# Patient Record
Sex: Female | Born: 1970 | Race: White | Hispanic: Yes | Marital: Married | Smoking: Never smoker
Health system: Southern US, Community
[De-identification: ages and names within clinical notes are randomized; demographics above are authoritative.]

## PROBLEM LIST (undated history)

## (undated) DIAGNOSIS — D649 Anemia, unspecified: Secondary | ICD-10-CM

## (undated) DIAGNOSIS — I10 Essential (primary) hypertension: Secondary | ICD-10-CM

## (undated) DIAGNOSIS — E119 Type 2 diabetes mellitus without complications: Secondary | ICD-10-CM

## (undated) DIAGNOSIS — E7800 Pure hypercholesterolemia, unspecified: Secondary | ICD-10-CM

## (undated) HISTORY — DX: Anemia, unspecified: D64.9

## (undated) HISTORY — PX: DILATION AND CURETTAGE OF UTERUS: SHX78

## (undated) HISTORY — DX: Type 2 diabetes mellitus without complications: E11.9

## (undated) HISTORY — DX: Essential (primary) hypertension: I10

---

## 2005-09-15 ENCOUNTER — Emergency Department (HOSPITAL_COMMUNITY): Admission: EM | Admit: 2005-09-15 | Discharge: 2005-09-15 | Payer: Self-pay | Admitting: Emergency Medicine

## 2006-05-23 IMAGING — CT CT PELVIS W/ CM
1 of 3 series · 13 of 32 positions shown, 18 images · IV contrast (omnipaque)
Comparison: none

CLINICAL DATA: 34-year-old with abdominal pain and vomiting.  
 ABDOMEN CT WITH CONTRAST:
TECHNIQUE: Multidetector CT imaging of the abdomen was performed following the standard protocol during bolus administration of intravenous contrast.
 Contrast:  125 cc Omnipaque 300
TECHNIQUE: Multidetector CT imaging of the pelvis was performed following the standard protocol during bolus administration of intravenous contrast.

[Series 2: abd/pel 5.0 b30f · axial · 0.74mm/px · z∈[+725,+1175]mm · 13 of 101 slices shown, 18 images]
[im 6/101  soft-tissue]
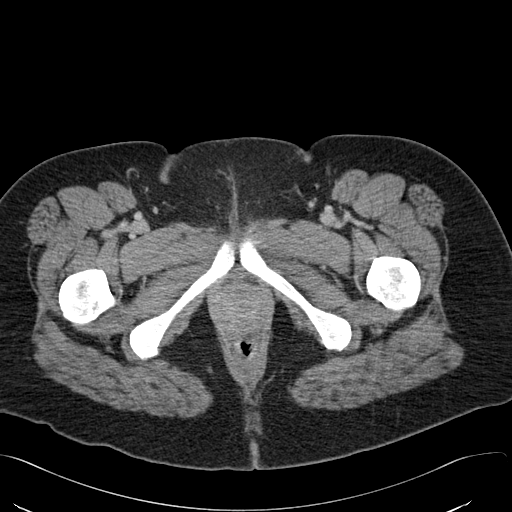
[im 6/101  bone]
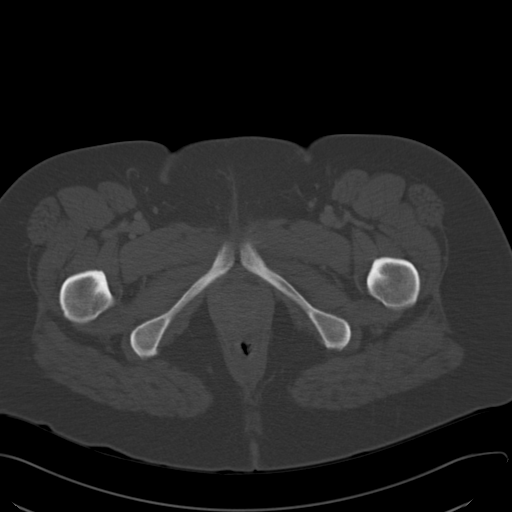
[im 16/101  soft-tissue]
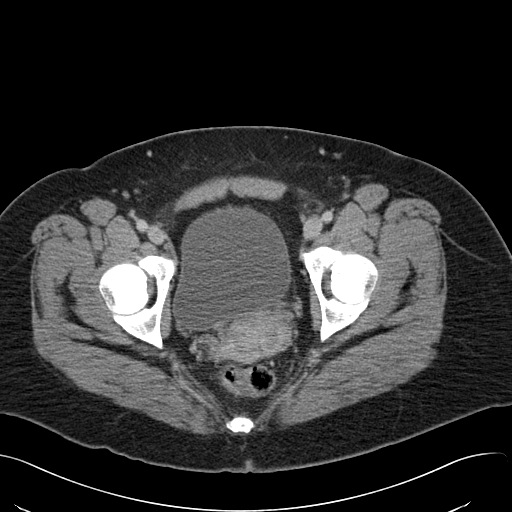
[im 21/101  soft-tissue]
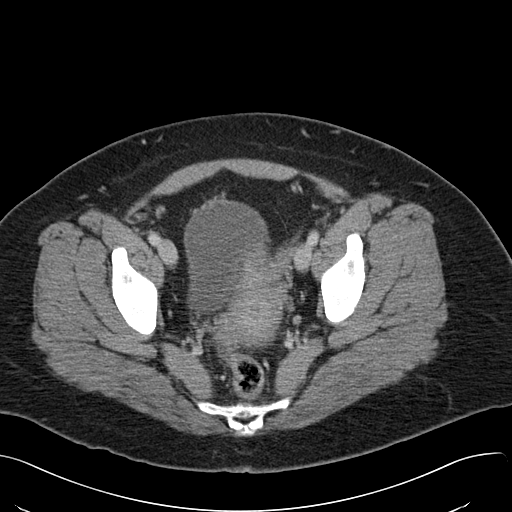
[im 31/101  soft-tissue]
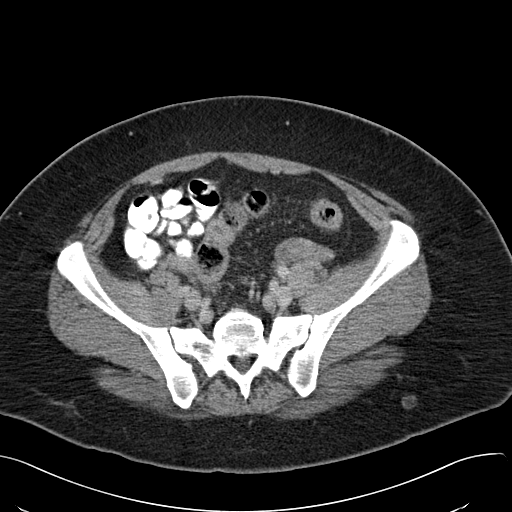
[im 41/101  soft-tissue]
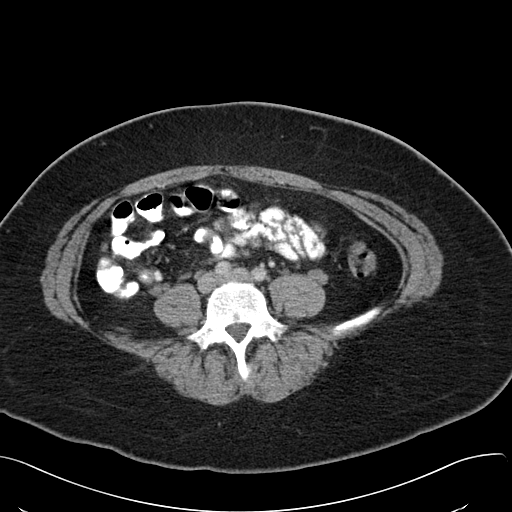
[im 46/101  soft-tissue]
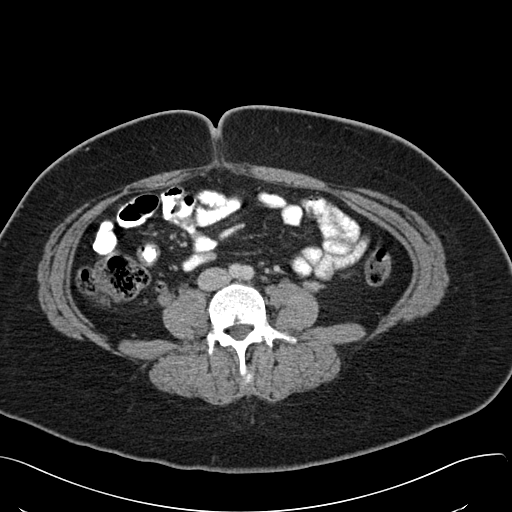
[im 56/101  soft-tissue]
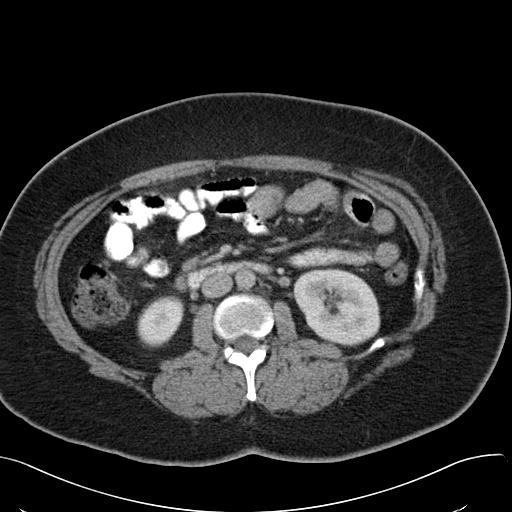
[im 61/101  soft-tissue]
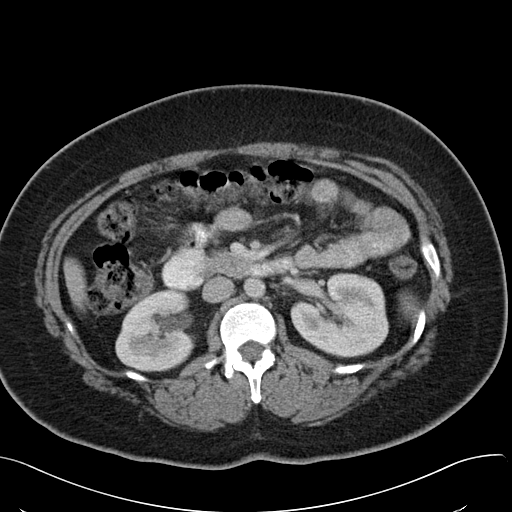
[im 71/101  soft-tissue]
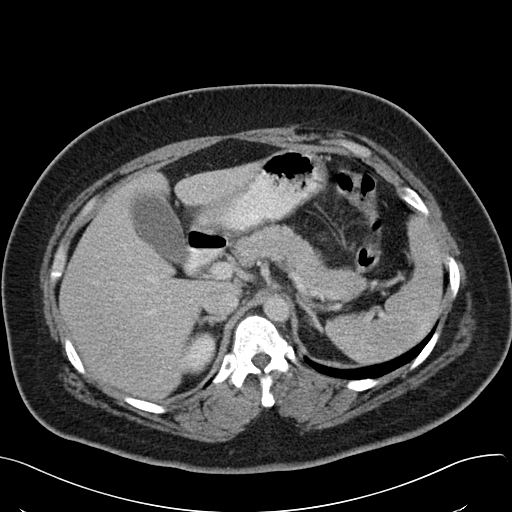
[im 71/101  bone]
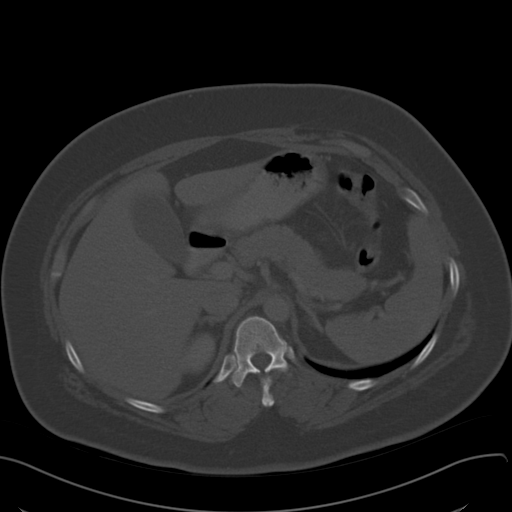
[im 81/101  soft-tissue]
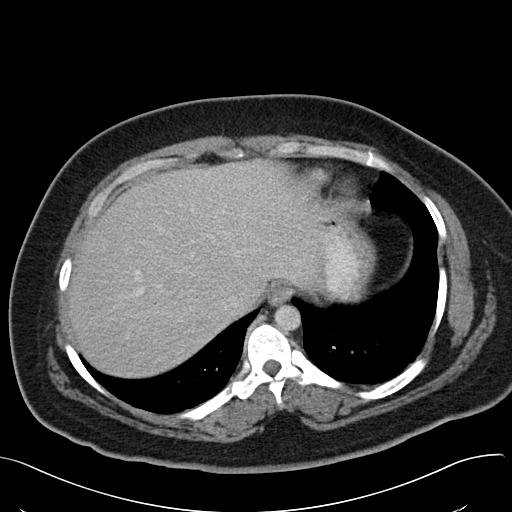
[im 81/101  lung]
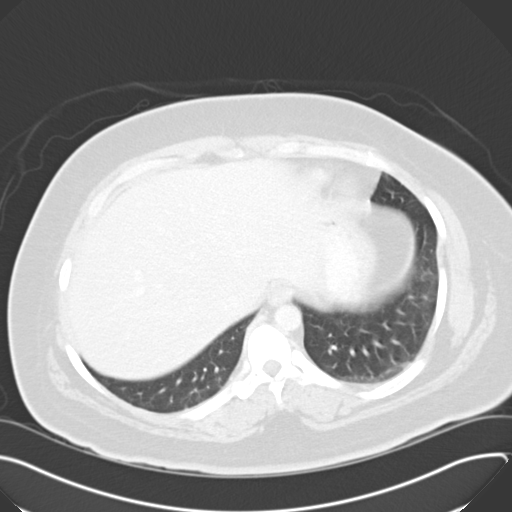
[im 86/101  soft-tissue]
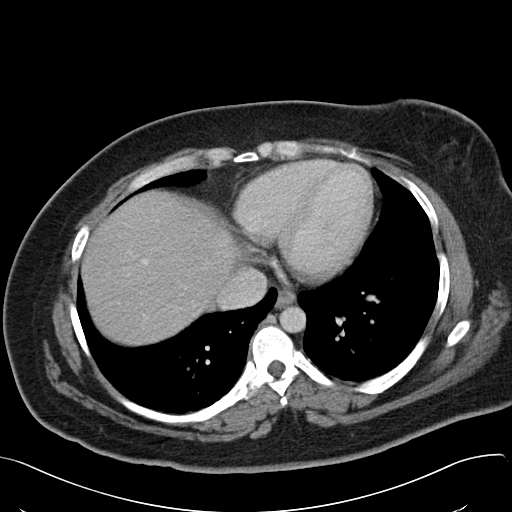
[im 86/101  lung]
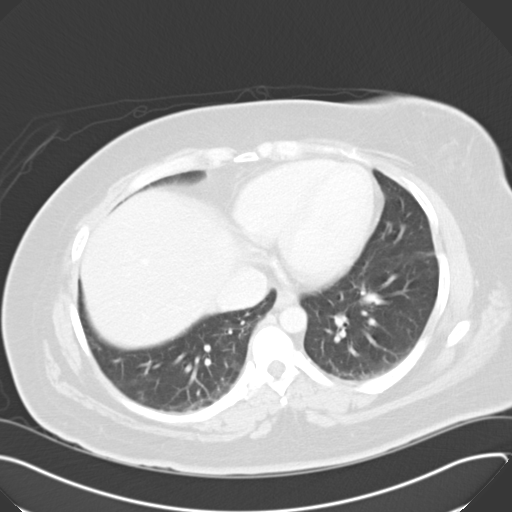
[im 91/101  lung]
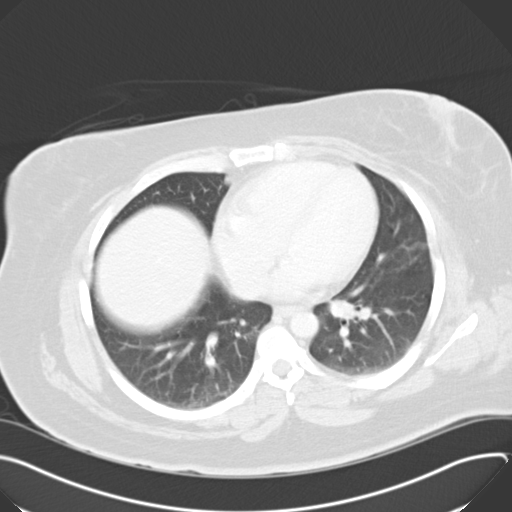
[im 96/101  soft-tissue]
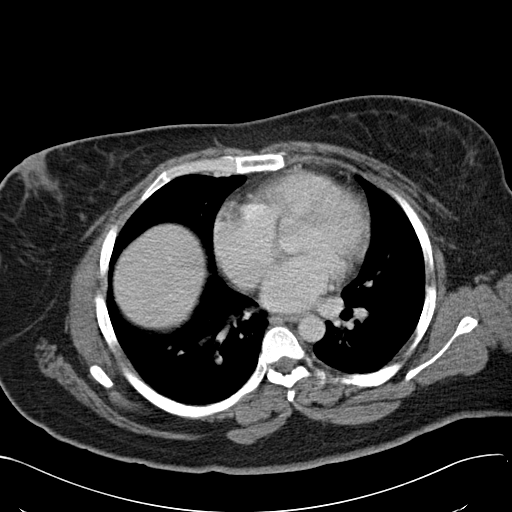
[im 96/101  lung]
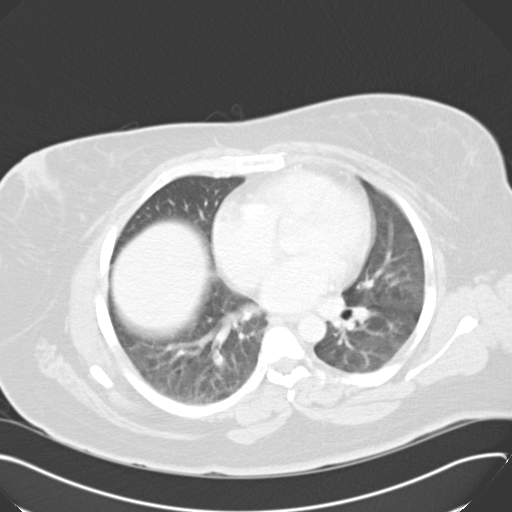

[13 of 32 positions shown; findings below may reference images not displayed]

FINDINGS: The lung bases are clear.  
 The liver is unremarkable.  There is fatty change in the falciform ligament which is not uncommon.  The gallbladder appears normal.  The spleen is normal in size.  The pancreas, adrenal glands and kidneys demonstrate no significant abnormalities.  The stomach, duodenum, small bowel and colon are unremarkable except for moderate constipation.  The appendix is visualized and is normal in appearance.  I do not see any hydronephrosis and there is no evidence for delay of contrast getting into the right ureter.  There is an area of increased attenuation in the proximal right ureter.  I think this is more likely to be contrast than a calculus but the fact that I do not see any contrast above it may suggest that it is a stone.  Recommend correlation with hematuria.  Follow-up noncontrast examination may be helpful to re-evaluate this finding.  The aorta is normal in caliber.  The major branch vessels are normal.  No mesenteric or retroperitoneal masses or adenopathy.
IMPRESSION: 1.  Focal area of increased attenuation in the upper right ureter.  It is possible this could be a non-obstructing calculus.  It is also possible this could be contrast.  Recommend correlation with any right flank pain or hematuria.  The patient may need a follow-up CT scan after the contrast has cleared. 
 2.  No other significant findings in the abdomen other than mild constipation.  
 PELVIS CT WITH CONTRAST:
FINDINGS: The uterus and ovaries appear normal.  The rectum, sigmoid colon and visualized small bowel loops appear normal.  No pelvic masses, adenopathy or free pelvic fluid collection.  No inflammatory changes.  The bony structures are unremarkable.
IMPRESSION: Unremarkable CT examination of the pelvis.

## 2008-02-01 ENCOUNTER — Emergency Department (HOSPITAL_COMMUNITY): Admission: EM | Admit: 2008-02-01 | Discharge: 2008-02-01 | Payer: Self-pay | Admitting: Emergency Medicine

## 2008-10-08 IMAGING — US US OB TRANSVAGINAL MODIFY
1 series · 14 of 28 positions shown · non-contrast
Comparison: None

CLINICAL DATA: Eight weeks 5 days pregnant with bleeding.

OBSTETRICAL ULTRASOUND <14 WK TRANSABDOMINAL AND TRANSVAGINAL OB:
TECHNIQUE: Both transabdominal and transvaginal ultrasound examinations were
performed for complete evaluation of the gestation as well as the maternal
uterus, adnexal regions, and pelvic cul-de-sac.

[Series 1: unknown · 0.32mm/px · 14 of 37 slices shown]
[im 2/37]
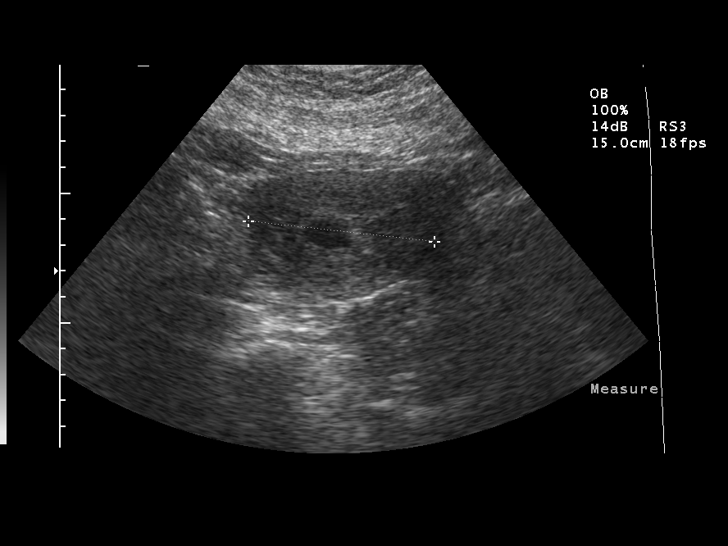
[im 5/37]
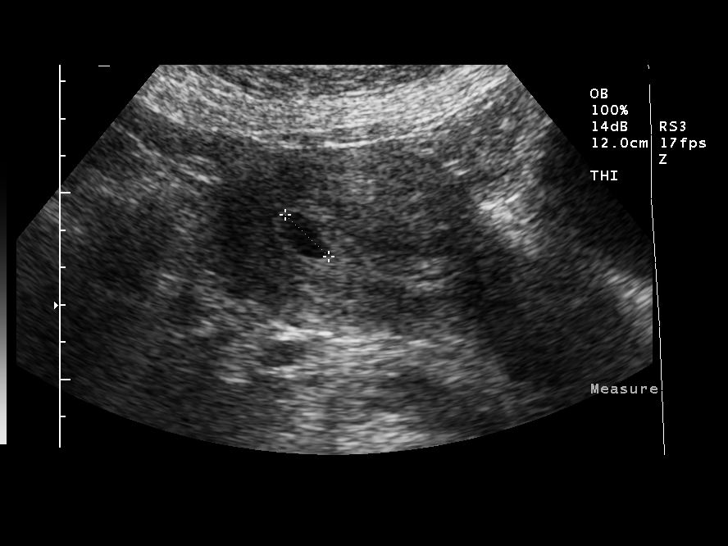
[im 7/37]
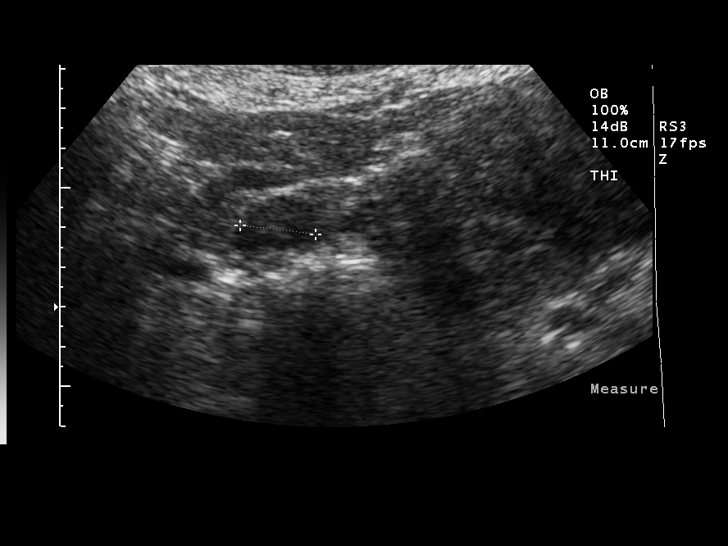
[im 10/37]
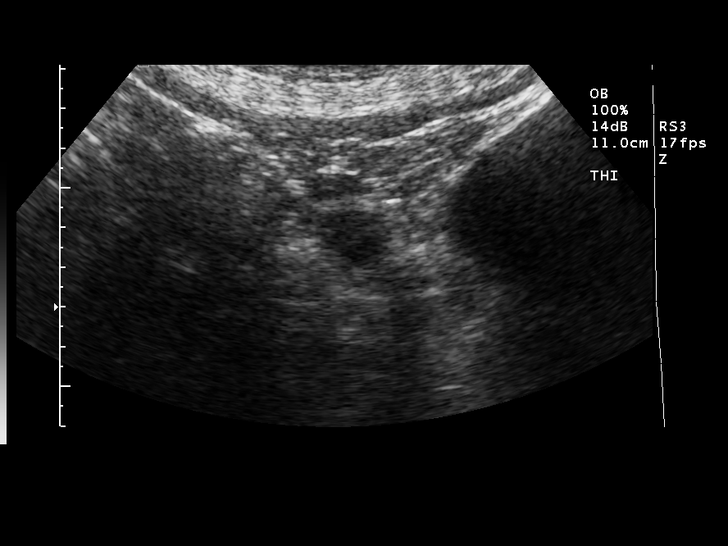
[im 13/37]
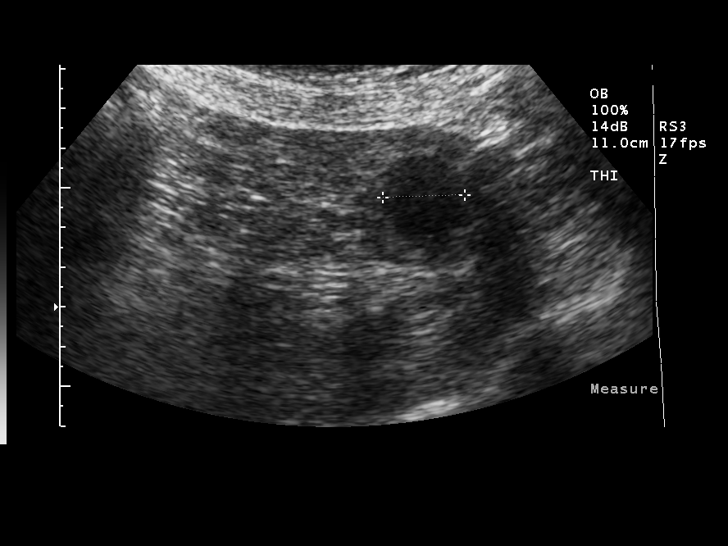
[im 15/37]
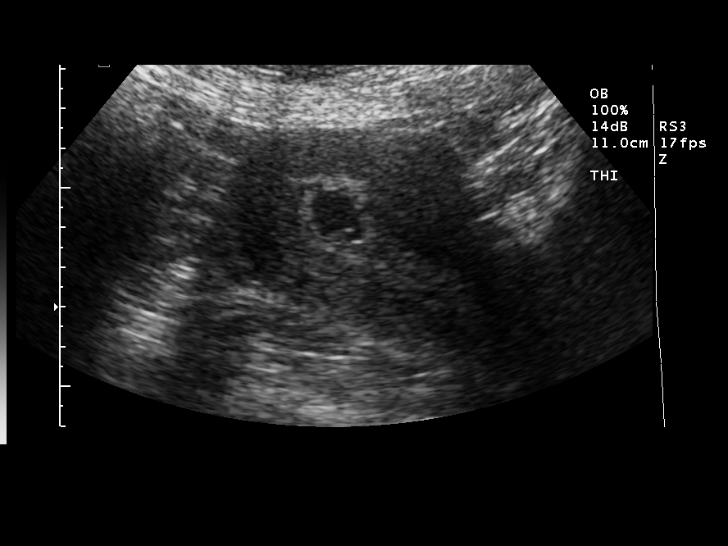
[im 18/37]
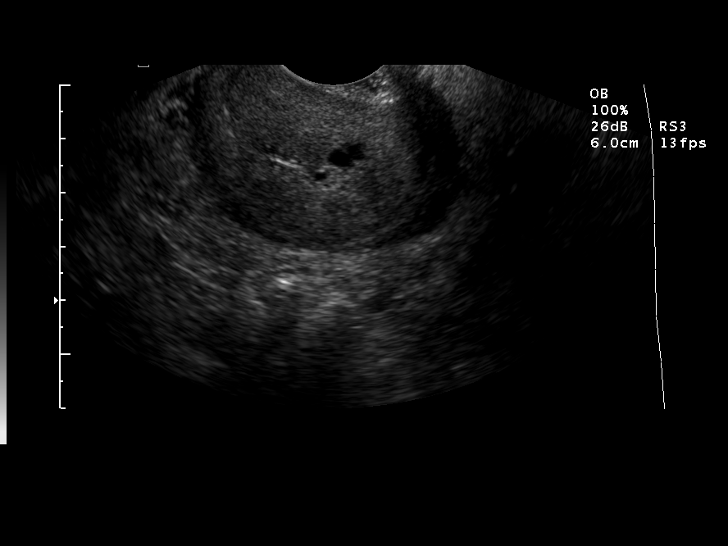
[im 21/37]
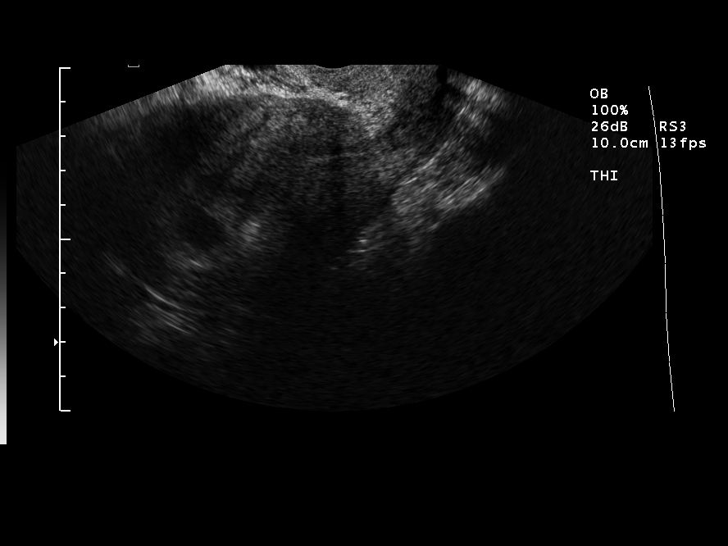
[im 23/37]
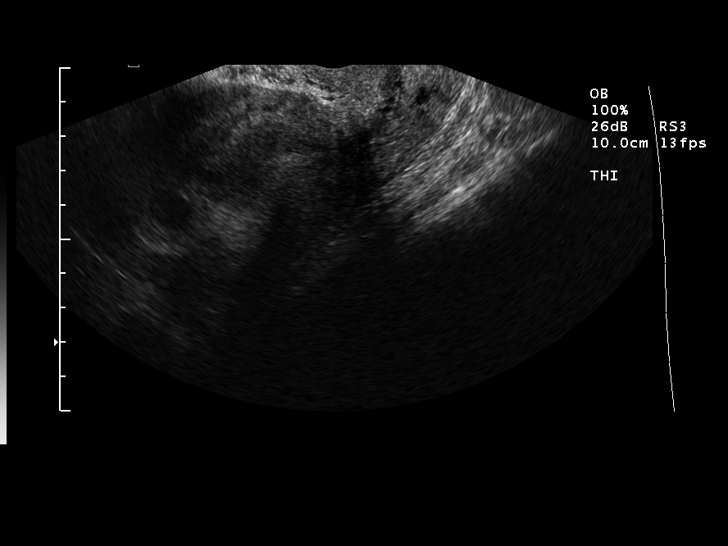
[im 26/37]
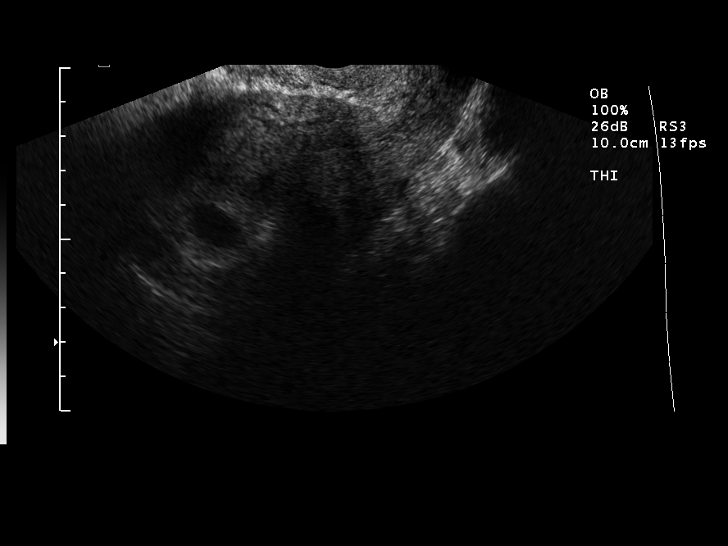
[im 29/37]
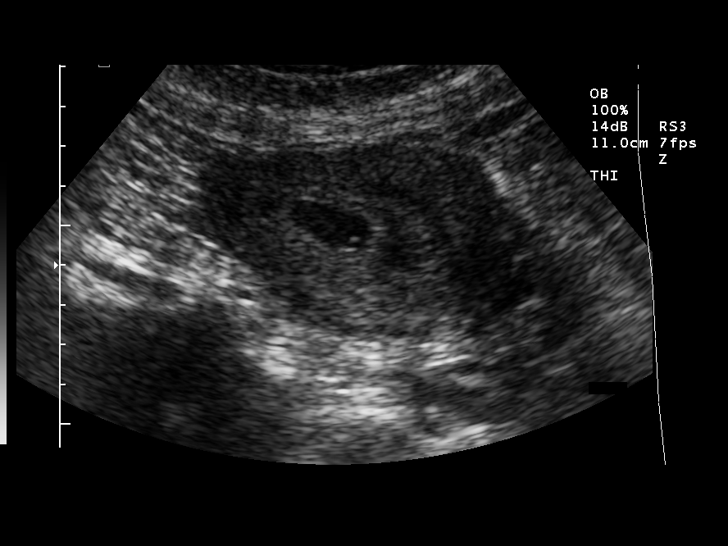
[im 31/37]
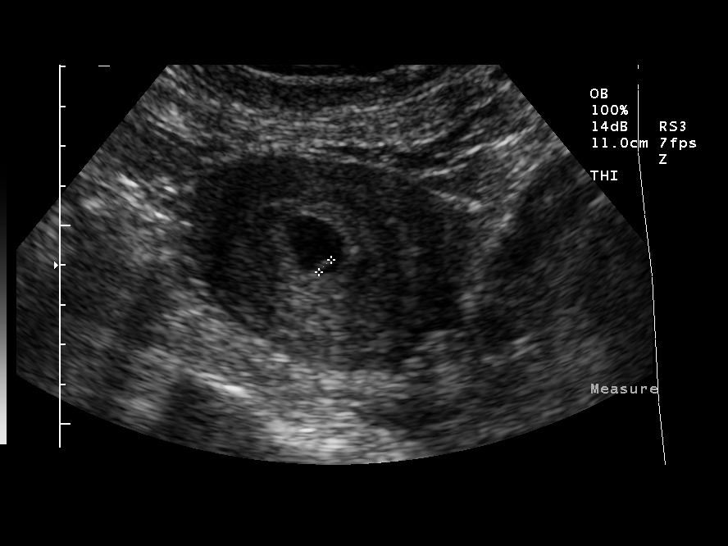
[im 34/37]
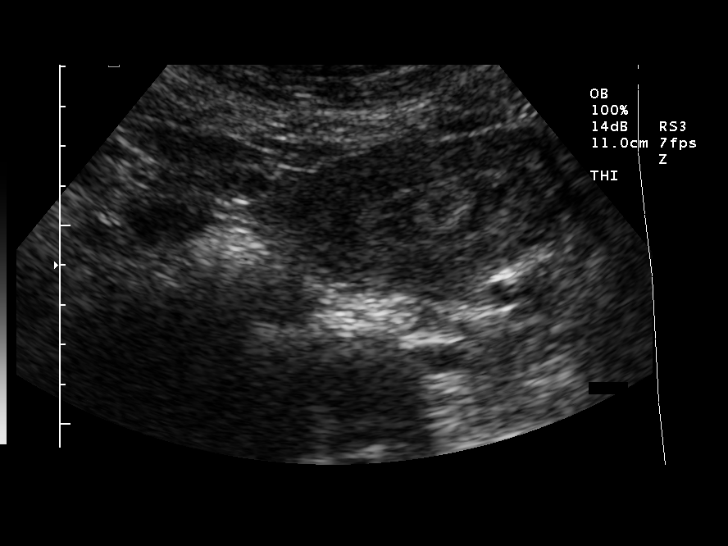
[im 37/37]
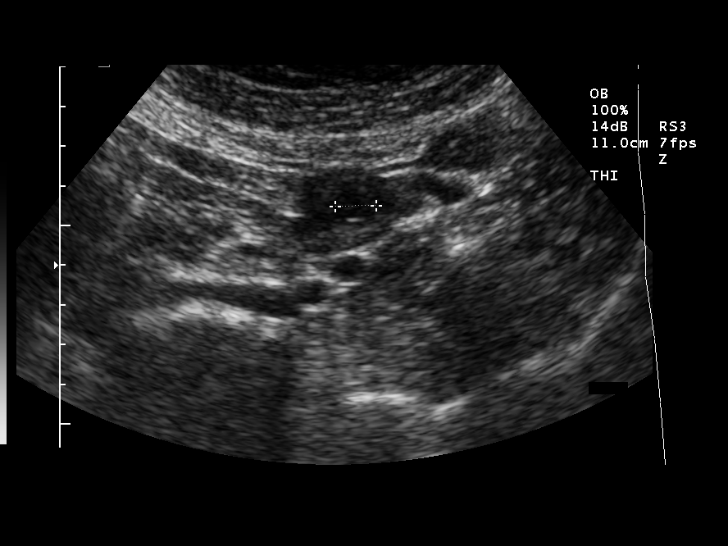

[14 of 28 positions shown; findings below may reference images not displayed]

FINDINGS: A endometrial fluid collection, likely representing a gestational sac
with a mean gestational sac diameter of 15 mm corresponds to 6 weeks 2  days. No
subchorionic hemorrhage.

A probable yolk sac and fetal pole identified. The crown-rump length of
approximately 4 mm corresponds to 6 weeks 0 days. No cardiac activity.

Right ovary within normal limits. Left ovary demonstrates a 1.4 cm cystic lesion
within. Note is made of nabothian cysts. 

No significant free fluid.
IMPRESSION: 1. Probable intrauterine pregnancy at approximately 6 weeks 0 days. Probable
small yolk sac and fetal pole.
2. No evidence of subchorionic hemorrhage.
3. Left ovarian cyst may represent a corpus luteal cyst.

## 2011-09-10 LAB — GC/CHLAMYDIA PROBE AMP, GENITAL: GC Probe Amp, Genital: NEGATIVE

## 2011-09-10 LAB — I-STAT 8, (EC8 V) (CONVERTED LAB)
BUN: 6
Bicarbonate: 22
Glucose, Bld: 89
HCT: 44
Hemoglobin: 15
Operator id: 288331
Potassium: 3.9
Sodium: 138
TCO2: 23
pCO2, Ven: 38.1 — ABNORMAL LOW
pH, Ven: 7.369 — ABNORMAL HIGH

## 2011-09-10 LAB — DIFFERENTIAL
Basophils Relative: 2 — ABNORMAL HIGH
Eosinophils Relative: 1
Lymphocytes Relative: 27
Monocytes Absolute: 0.5

## 2011-09-10 LAB — URINALYSIS, ROUTINE W REFLEX MICROSCOPIC
Glucose, UA: NEGATIVE
Ketones, ur: NEGATIVE
Leukocytes, UA: NEGATIVE
Specific Gravity, Urine: 1.012
Urobilinogen, UA: 0.2
pH: 6

## 2011-09-10 LAB — CBC
Hemoglobin: 13.3
MCV: 88.5
RBC: 4.45

## 2011-09-10 LAB — URINE CULTURE

## 2011-09-10 LAB — WET PREP, GENITAL

## 2011-09-10 LAB — URINE MICROSCOPIC-ADD ON

## 2011-09-10 LAB — HCG, QUANTITATIVE, PREGNANCY: hCG, Beta Chain, Quant, S: 17013 — ABNORMAL HIGH

## 2011-11-16 ENCOUNTER — Inpatient Hospital Stay (HOSPITAL_COMMUNITY)
Admission: AD | Admit: 2011-11-16 | Discharge: 2011-11-16 | Disposition: A | Discharge status: Home / Self Care | Payer: Self-pay | Source: Ambulatory Visit | Attending: Emergency Medicine | Admitting: Emergency Medicine

## 2011-11-16 ENCOUNTER — Encounter (HOSPITAL_COMMUNITY): Payer: Self-pay | Admitting: *Deleted

## 2011-11-16 ENCOUNTER — Other Ambulatory Visit: Payer: Self-pay

## 2011-11-16 ENCOUNTER — Inpatient Hospital Stay (HOSPITAL_COMMUNITY): Payer: Self-pay

## 2011-11-16 DIAGNOSIS — Z30431 Encounter for routine checking of intrauterine contraceptive device: Secondary | ICD-10-CM | POA: Insufficient documentation

## 2011-11-16 DIAGNOSIS — R55 Syncope and collapse: Secondary | ICD-10-CM | POA: Insufficient documentation

## 2011-11-16 HISTORY — DX: Pure hypercholesterolemia, unspecified: E78.00

## 2011-11-16 LAB — URINALYSIS, ROUTINE W REFLEX MICROSCOPIC
Glucose, UA: NEGATIVE mg/dL
Ketones, ur: NEGATIVE mg/dL
Protein, ur: NEGATIVE mg/dL
Specific Gravity, Urine: <1.005 — ABNORMAL LOW (ref 1.005–1.030)
pH: 5 (ref 5.0–8.0)

## 2011-11-16 LAB — COMPREHENSIVE METABOLIC PANEL
ALT: 15 U/L (ref 0–35)
BUN: 9 mg/dL (ref 6–23)
Chloride: 102 mEq/L (ref 96–112)
Glucose, Bld: 93 mg/dL (ref 70–99)
Total Protein: 7.5 g/dL (ref 6.0–8.3)

## 2011-11-16 LAB — CBC
Platelets: 310 10*3/uL (ref 150–400)
WBC: 10.7 10*3/uL — ABNORMAL HIGH (ref 4.0–10.5)

## 2011-11-16 LAB — URINE MICROSCOPIC-ADD ON

## 2011-11-16 MED ORDER — ACETAMINOPHEN 500 MG PO TABS
1000.0000 mg | ORAL_TABLET | Freq: Once | ORAL | Status: AC
  Administered 2011-11-16: 1000 mg via ORAL
  Filled 2011-11-16: qty 2

## 2011-11-16 MED ORDER — LACTATED RINGERS IV BOLUS (SEPSIS)
1000.0000 mL | Freq: Once | INTRAVENOUS | Status: AC
  Administered 2011-11-16: 1000 mL via INTRAVENOUS

## 2011-11-16 MED ORDER — KETOROLAC TROMETHAMINE 30 MG/ML IJ SOLN
30.0000 mg | Freq: Once | INTRAMUSCULAR | Status: AC
  Administered 2011-11-16: 30 mg via INTRAVENOUS
  Filled 2011-11-16: qty 1

## 2011-11-16 MED ORDER — LACTATED RINGERS IV SOLN
INTRAVENOUS | Status: DC
  Administered 2011-11-16: 12:00:00 via INTRAVENOUS

## 2011-11-16 NOTE — ED Provider Notes (Signed)
Desiree Morse [id number] @Unknown  Chief Complaint  Patient presents with  . Vaginal Bleeding    SUBJECTIVE  HPI: She presents via EMS following a fainting episode this morning. Her friend who accompanies her states she witnessed when she crumpled to the floor; had no head or other injury and was completely  unconscious for about 1 minute. She still feels faint and dizzy, has severe headache, ringing in her ears and blurry vision. No prior history of seizure disorder or syncope. Denies any gastrointestinal bleeding. Has mild lower abd crampy pain. Began menses last night and has used a total of 4 pads. Has had IUD for 3 years. Denies history of fibroids or abnormal uterine bleeding. No chest pain or shortness of breath. Denies stress, anxiety, change in home situation.  Past Medical History  Diagnosis Date  . Hypercholesteremia    Past Surgical History  Procedure Date  . Dilation and curettage of uterus    History   Social History  . Marital Status: Married    Spouse Name: N/A    Number of Children: N/A  . Years of Education: N/A   Occupational History  . Not on file.   Social History Main Topics  . Smoking status: Never Smoker   . Smokeless tobacco: Not on file  . Alcohol Use: No  . Drug Use: No  . Sexually Active: Yes    Birth Control/ Protection: IUD   Other Topics Concern  . Not on file   Social History Narrative  . No narrative on file   No current facility-administered medications on file prior to encounter.   No current outpatient prescriptions on file prior to encounter.   Allergies not on file  ROS: Pertinent items in HPI  OBJECTIVE  BP 119/68  Pulse 66  Temp(Src) 97.9 F (36.6 C) (Oral)  Resp 16  Wt 90.266 kg (199 lb)  SpO2 96%  LMP 09/04/2011   Physical Exam  Constitutional: She is oriented to person, place, and time. She appears distressed.  HENT:  Head: Normocephalic and atraumatic.  Eyes: EOM are normal. Pupils are equal, round,  and reactive to light.  Neck: Normal range of motion. Neck supple.  Cardiovascular: Normal rate, regular rhythm, normal heart sounds and intact distal pulses.  Exam reveals no gallop.   No murmur heard. Pulmonary/Chest: Effort normal and breath sounds normal. She has no wheezes. She exhibits no tenderness.  Abdominal: Soft. Bowel sounds are normal. She exhibits no distension. There is no tenderness. There is no rebound and no guarding.  Genitourinary: Vagina normal, uterus normal and cervix normal. No vaginal discharge found.       Mod amt menstrual like blood in vag vault. Cx clean, parous os, IUD strings not visualized. No CMT. Difficult to outline uterus due to body habitus. No adnexal mass or tenderness. Ext os 1 cm, unable to palpate IUD strings in canal.  Neurological: She is alert and oriented to person, place, and time. She has normal reflexes. She displays normal reflexes. Coordination normal.  Skin: Skin is warm and dry.  Psychiatric: Judgment normal.       At times appears obtunded.   MAU course: H/A much improved with Tylenol. Too symptomatic to walk to BR. Used bedpan After IVF bolus on attempt to stand for orthostatics, fell onto bed and revived by [employer] using ammonia salts.  [place]: IUD in proper location  Results for orders placed during the hospital encounter of 11/16/11 (from the past 24 hour(s))  COMPREHENSIVE METABOLIC PANEL  Status: Abnormal   Collection Time   11/16/11  8:56 AM      Component Value Range   Sodium 134 (*) 135 - 145 (mEq/L)   Potassium 4.9  3.5 - 5.1 (mEq/L)   Chloride 102  96 - 112 (mEq/L)   CO2 26  19 - 32 (mEq/L)   Glucose, Bld 93  70 - 99 (mg/dL)   BUN 9  6 - 23 (mg/dL)   Creatinine, Ser 2.95  0.50 - 1.10 (mg/dL)   Calcium 9.2  8.4 - 62.1 (mg/dL)   Total Protein 7.5  6.0 - 8.3 (g/dL)   Albumin 3.8  3.5 - 5.2 (g/dL)   AST 23  0 - 37 (U/L)   ALT 15  0 - 35 (U/L)   Alkaline Phosphatase 75  39 - 117 (U/L)   Total Bilirubin 0.2 (*) 0.3 - 1.2 (mg/dL)     GFR calc non Af Amer >90  >90 (mL/min)   GFR calc Af Amer >90  >90 (mL/min)  CBC     Status: Abnormal   Collection Time   11/16/11  8:56 AM      Component Value Range   WBC 10.7 (*) 4.0 - 10.5 (K/uL)   RBC 4.53  3.87 - 5.11 (MIL/uL)   Hemoglobin 13.0  12.0 - 15.0 (g/dL)   HCT 30.8  65.7 - 84.6 (%)   MCV 87.6  78.0 - 100.0 (fL)   MCH 28.7  26.0 - 34.0 (pg)   MCHC 32.7  30.0 - 36.0 (g/dL)   RDW 96.2  95.2 - 84.1 (%)   Platelets 310  150 - 400 (K/uL)  URINALYSIS, ROUTINE W REFLEX MICROSCOPIC     Status: Abnormal   Collection Time   11/16/11  9:20 AM      Component Value Range   Color, Urine STRAW (*) YELLOW    Appearance CLEAR  CLEAR    Specific Gravity, Urine <1.005 (*) 1.005 - 1.030    pH 5.0  5.0 - 8.0    Glucose, UA NEGATIVE  NEGATIVE (mg/dL)   Hgb urine dipstick LARGE (*) NEGATIVE    Bilirubin Urine NEGATIVE  NEGATIVE    Ketones, ur NEGATIVE  NEGATIVE (mg/dL)   Protein, ur NEGATIVE  NEGATIVE (mg/dL)   Urobilinogen, UA 0.2  0.0 - 1.0 (mg/dL)   Nitrite NEGATIVE  NEGATIVE    Leukocytes, UA SMALL (*) NEGATIVE   URINE MICROSCOPIC-ADD ON     Status: Abnormal   Collection Time   11/16/11  9:20 AM      Component Value Range   Squamous Epithelial / LPF MANY (*) RARE    WBC, UA 3-6  <3 (WBC/hpf)   RBC / HPF 7-10  <3 (RBC/hpf)  POCT PREGNANCY, URINE     Status: Normal   Collection Time   11/16/11  9:26 AM      Component Value Range   Preg Test, Ur NEGATIVE    WET PREP, GENITAL     Status: Abnormal   Collection Time   11/16/11 12:19 PM      Component Value Range   Yeast, Wet Prep NONE SEEN  NONE SEEN    Trich, Wet Prep NONE SEEN  NONE SEEN    Clue Cells, Wet Prep FEW (*) NONE SEEN    WBC, Wet Prep HPF POC FEW (*) NONE SEEN     ASSESSMENT  Persistent syncope, unknown etiology ? Vasovagal Retracted IUD strings   PLAN Transfer to [facility] for further evaluation and management

## 2011-11-16 NOTE — ED Notes (Signed)
Pt's IV placed on pump, pt called out, IV infiltrated.  IV DC'd immediately.  Pt has egg-size infiltration L antecubital.

## 2011-11-16 NOTE — Progress Notes (Signed)
Pt came in EMS with C/O vaginal bleeding that started last night, lower abd pain.  Pt has changed 4 pads during the night, passed quarter size clots.  Attempted to assist pt up to BR for urine specimen, pt became very dizzy, was taken back to room in [facility].  Bernita Buffy CNM @ bedside.  VS WNL.  Eda Royal interpreting.

## 2011-11-16 NOTE — ED Notes (Signed)
NT was obtaining orthostatic VS, pt tolerated lying & sitting BP's, fainted when she stood up.  Amonia inhalant used, pt now oriented x 3.  D. Poe CNM informed, RT @ bedside for EKG.

## 2011-11-16 NOTE — ED Notes (Signed)
ZOX:[id number]<AV> Expected date:<BR> Expected time:<BR> Means of arrival:<BR> Comments:<BR> Hold for [facility]link coming from [facility]

## 2011-11-16 NOTE — ED Notes (Signed)
Pt is leaving AMA

## 2011-11-16 NOTE — ED Notes (Signed)
Secondary assessment: pt reports multiple syncope episode today, doesn't not remember any, reports nausea, dizziness, and vertigo. No vomiting, CP, SOB. Cramping and sharp pain at Lower Abdomen and pubic area 10/10 radiates to the back. Pt currently on her menstrual cycle but reports the cramp is more severe than usual menstrual cramp. Pt alert, oriented, calm in room.

## 2011-11-16 NOTE — ED Provider Notes (Cosign Needed)
History     CSN: [id number] Arrival date & time: 11/16/2011  8:33 AM   First MD Initiated Contact with Patient 11/16/11 1753      Chief Complaint  Patient presents with  . Vaginal Bleeding    (Consider location/radiation/quality/duration/timing/severity/associated sxs/prior treatment) Patient is a 40 y.o. female presenting with vaginal bleeding and syncope.  Vaginal Bleeding Associated symptoms include abdominal pain.  Loss of Consciousness This is a new problem. The current episode started 6 to 12 hours ago. The problem occurs constantly. The problem has not changed since onset.Associated symptoms include abdominal pain. The symptoms are aggravated by standing. Improvement on treatment: And was seen at [facility]. She had physical exam including pelvic exam. She was found to be menstruating. She as an IUD in place. She had extensive laboratory testing with a normal CBC normal urinalysis and negative pregnancy test negative wet prep,    Past Medical History  Diagnosis Date  . Hypercholesteremia     Past Surgical History  Procedure Date  . Dilation and curettage of uterus     No family history on file.  History  Substance Use Topics  . Smoking status: Never Smoker   . Smokeless tobacco: Not on file  . Alcohol Use: No    OB History    Grav Para Term Preterm Abortions TAB SAB Ect Mult Living   4 3 3  1  1   3       Review of Systems  HENT: Negative.   Respiratory: Negative.   Cardiovascular: Positive for syncope.       Syncope   Gastrointestinal: Positive for abdominal pain.  Genitourinary: Positive for vaginal bleeding and menstrual problem.  Musculoskeletal: Negative.   Skin: Negative.   Neurological: Negative.   Psychiatric/Behavioral: Negative.     Allergies  Penicillins  Home Medications   Current Outpatient Rx  Name Route Sig Dispense Refill  . IBUPROFEN 600 MG PO TABS Oral Take 600 mg by mouth every 6 (six) hours as needed. For pain        BP 120/67  Pulse 76  Temp(Src) 98.1 F (36.7 C) (Oral)  Resp 18  Wt 199 lb (90.266 kg)  SpO2 99%  LMP 09/04/2011  Physical Exam  Constitutional: She is oriented to person, place, and time. She appears well-developed and well-nourished. No distress.  HENT:  Head: Normocephalic and atraumatic.  Right Ear: External ear normal.  Left Ear: External ear normal.  Mouth/Throat: Oropharynx is clear and moist.  Eyes: Conjunctivae and EOM are normal. Pupils are equal, round, and reactive to light.  Neck: Normal range of motion. Neck supple. No thyromegaly present.  Cardiovascular: Normal rate, regular rhythm and normal heart sounds.   Pulmonary/Chest: Breath sounds normal.  Abdominal: Soft.       She localizes some tenderness to the suprapubic region.  Musculoskeletal: Normal range of motion.  Lymphadenopathy:    She has no cervical adenopathy.  Neurological: She is alert and oriented to person, place, and time.       No sensory or motor deficits.  Skin: Skin is warm and dry.  Psychiatric: She has a normal mood and affect. Her behavior is normal.    ED Course  Procedures (including critical care time)  Labs Reviewed  COMPREHENSIVE METABOLIC PANEL - Abnormal; Notable for the following:    Sodium 134 (*)    Total Bilirubin 0.2 (*)    All other components within normal limits  CBC - Abnormal; Notable for the following:  WBC 10.7 (*)    All other components within normal limits  URINALYSIS, ROUTINE W REFLEX MICROSCOPIC - Abnormal; Notable for the following:    Color, Urine STRAW (*)    Specific Gravity, Urine <1.005 (*)    Hgb urine dipstick LARGE (*)    Leukocytes, UA SMALL (*)    All other components within normal limits  URINE MICROSCOPIC-ADD ON - Abnormal; Notable for the following:    Squamous Epithelial / LPF MANY (*)    All other components within normal limits  WET PREP, GENITAL - Abnormal; Notable for the following:    Clue Cells, Wet Prep FEW (*)    WBC, Wet  Prep HPF POC FEW (*) FEW BACTERIA SEEN   All other components within normal limits  POCT PREGNANCY, URINE  POCT PREGNANCY, URINE  GC/CHLAMYDIA PROBE AMP, GENITAL   US Transvaginal Non-ob  11/16/2011  *RADIOLOGY REPORT*  Clinical Data: IUD strings not found; pain and bleeding  TRANSVAGINAL ULTRASOUND OF PELVIS  Technique:  Transvaginal ultrasound examination of the pelvis was performed including evaluation of the uterus, ovaries, adnexal regions, and pelvic cul-de-sac.  Comparison:  February 01, 2008  Findings:  The uterus is normal in size and echotexture, measuring 11.3 x 4.4 x 4.9 cm.  The endometrial stripe is not thickened, measuring approximately 5 mm in width.  The IUD is in the proper location within the endometrial cavity at the fundus.  The right ovary is not visualized.  The left ovary has a normal size and appearance, measuring 2.4 x 1.7 x 2.0 cm.  There are no adnexal masses or free pelvic fluid.  IMPRESSION: The IUD is in the proper location within the endometrium at the fundus.  Original Report Authenticated By: Brandon Melnick, M.D.    6:11 PM Patient was seen and had physical examination. Her chart from [facility] was reviewed, and shows an extensive and complete workup. Her laboratory tests were all normal. I advised her to stay and receive IV fluids and orthostatic vital signs. However, she decided that she wanted to leave because she needs to pick up her children at a day care center in [place], [place].  She says that after that she will go to a hospital in [place], [place]. For further evaluation. I advised against this, but she insisted on leaving.   1. Syncope         Carleene Cooper III, MD 11/16/11 [phone]

## 2011-11-16 NOTE — ED Notes (Signed)
IV attempt x 2, unsuccessful, one in R hand, one in R antecubital.

## 2011-11-16 NOTE — ED Notes (Signed)
Pt's friend reports that pt fainted @ work this a.m. & fell backward, pt has no memory of this.  Pt also states she had GI virus last week with vomitting & diarrhea.

## 2011-11-17 LAB — GC/CHLAMYDIA PROBE AMP, GENITAL
Chlamydia, DNA Probe: POSITIVE — AB
GC Probe Amp, Genital: NEGATIVE

## 2011-11-17 NOTE — ED Provider Notes (Signed)
Attestation of Attending Supervision of Advanced Practitioner: Evaluation and management procedures were performed by the PA/NP/CNM/OB Fellow under my supervision/collaboration. Chart reviewed, and agree with management and plan.  Rahma Meller, M.D. 11/17/2011 1:40 PM   

## 2011-11-18 ENCOUNTER — Telehealth (HOSPITAL_COMMUNITY): Payer: Self-pay | Admitting: *Deleted

## 2011-11-18 NOTE — Telephone Encounter (Signed)
Telephone call to patient regarding positive chlamydia culture, patient not in, left message for her to call.  Patient was not treated at time of visit.  Patient will need referral to Health Department for treatment.  Patient lives in [place].  Patient will need to notify her partner for treatment.  Report faxed to health department.

## 2012-07-23 IMAGING — US US TRANSVAGINAL NON-OB
1 series · 14 of 25 positions shown · non-contrast
Comparison: February 01, 2008

CLINICAL DATA: IUD strings not found; pain and bleeding

TRANSVAGINAL ULTRASOUND OF PELVIS
TECHNIQUE: Transvaginal ultrasound examination of the pelvis was
performed including evaluation of the uterus, ovaries, adnexal
regions, and pelvic cul-de-sac.

[Series 1: us transvaginal non-ob · 33 acquisitions, 14 frames shown]
[im 1/33]
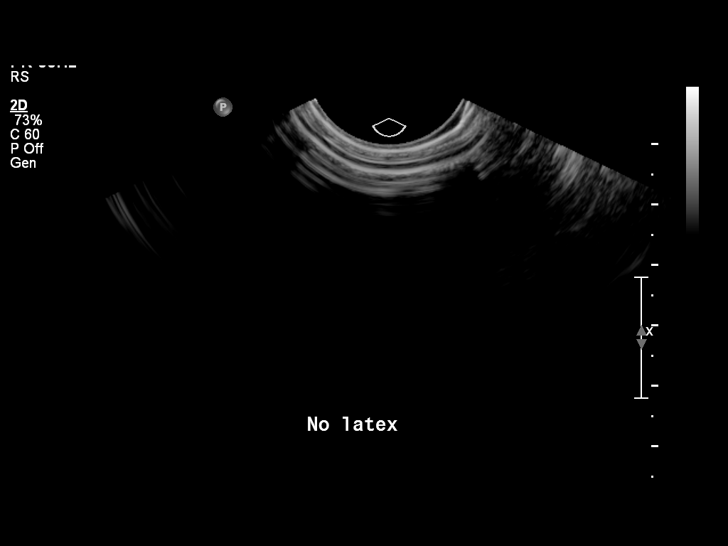
[im 3/33]
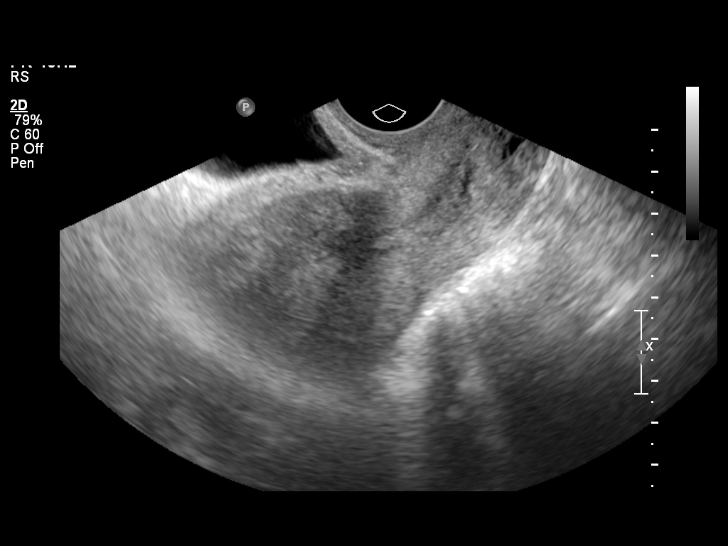
[im 6/33]
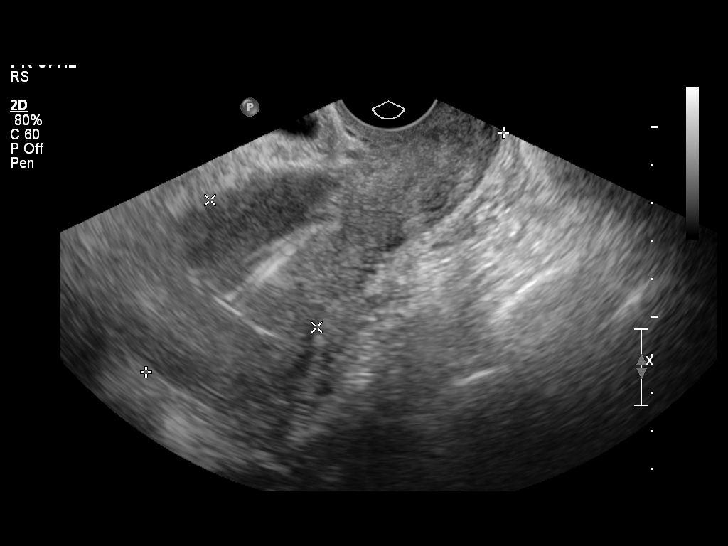
[im 9/33]
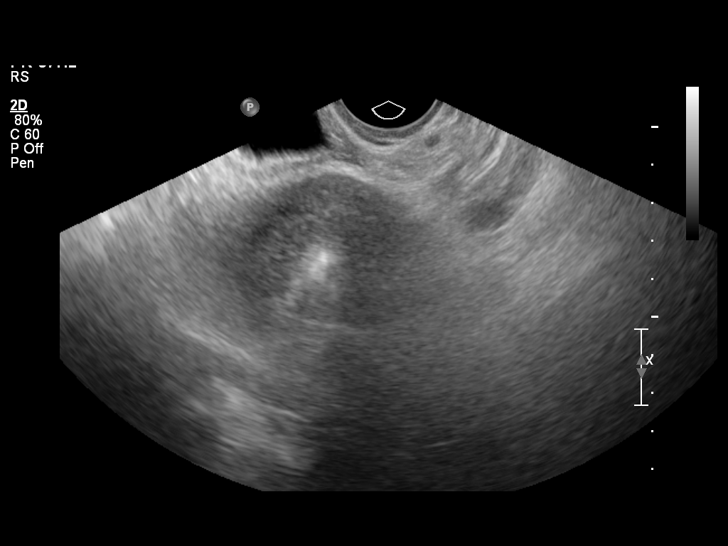
[im 11/33]
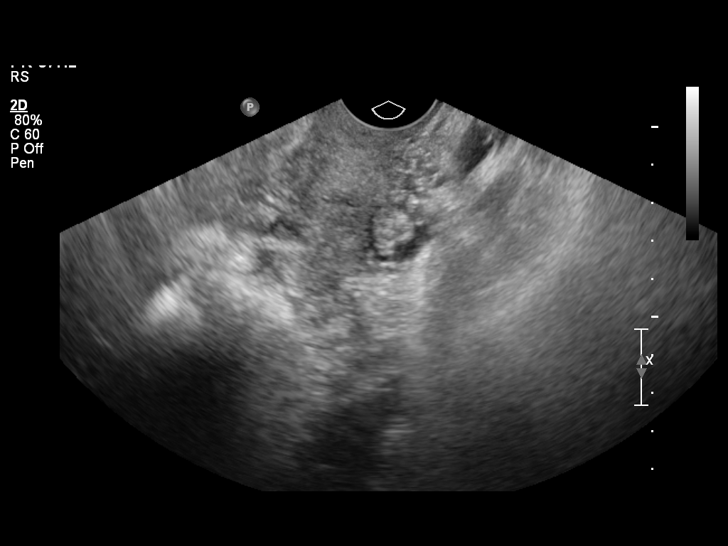
[im 13/33]
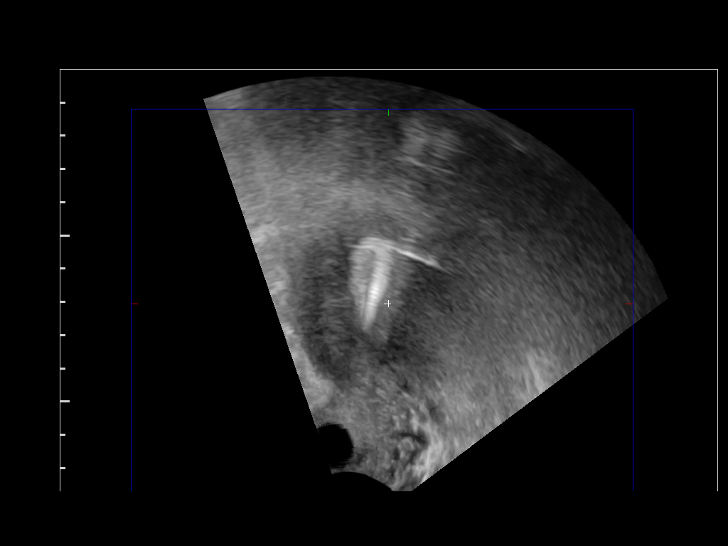
[im 15/33]
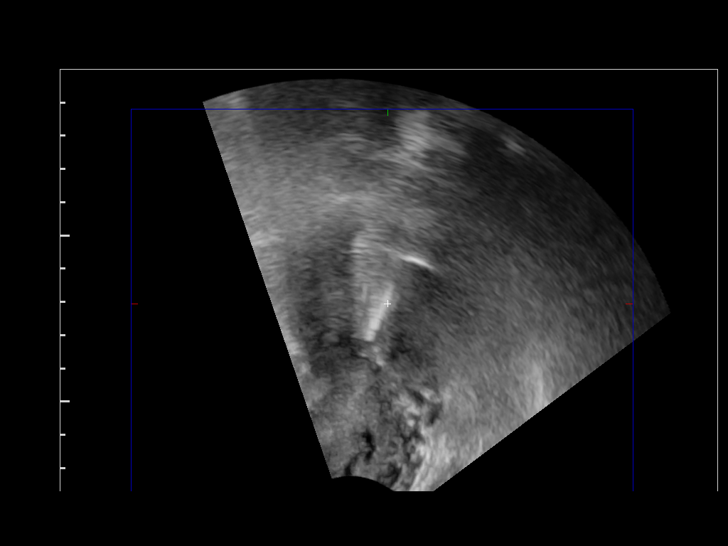
[im 18/33]
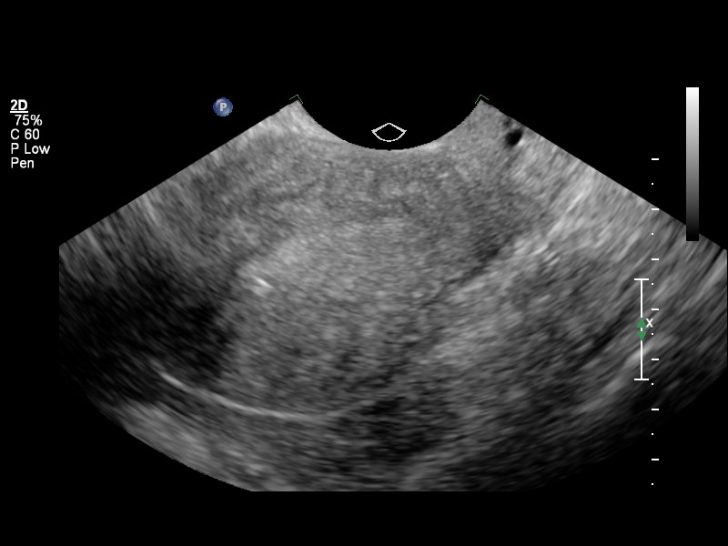
[im 21/33]
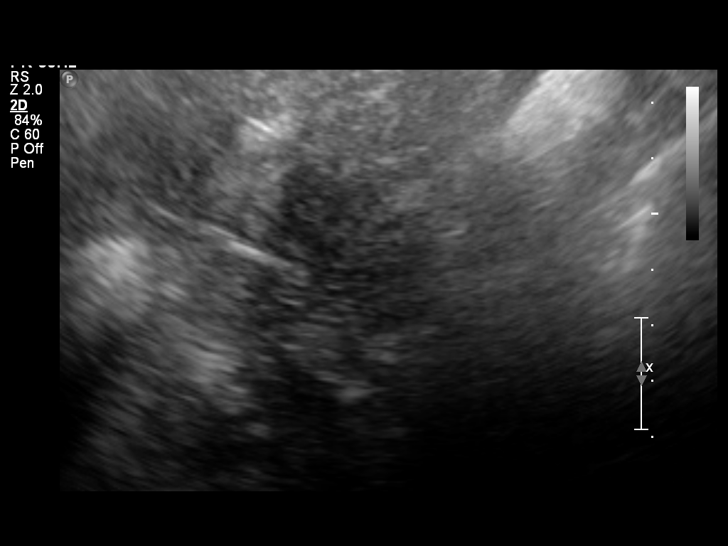
[im 22/33]
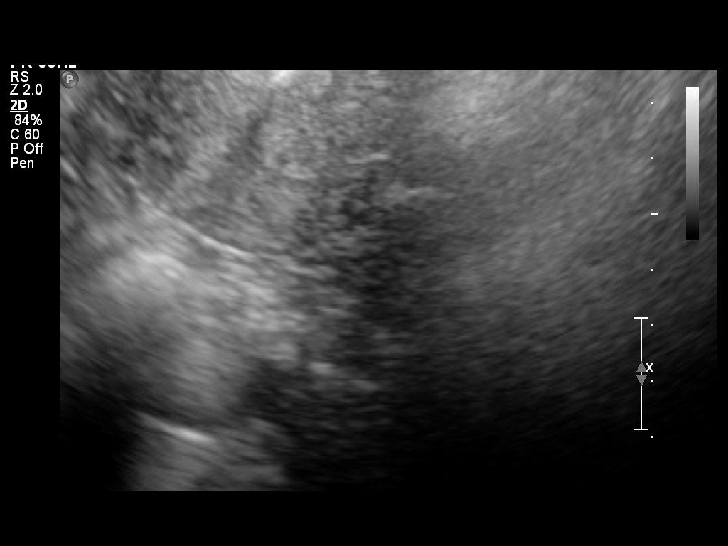
[im 25/33]
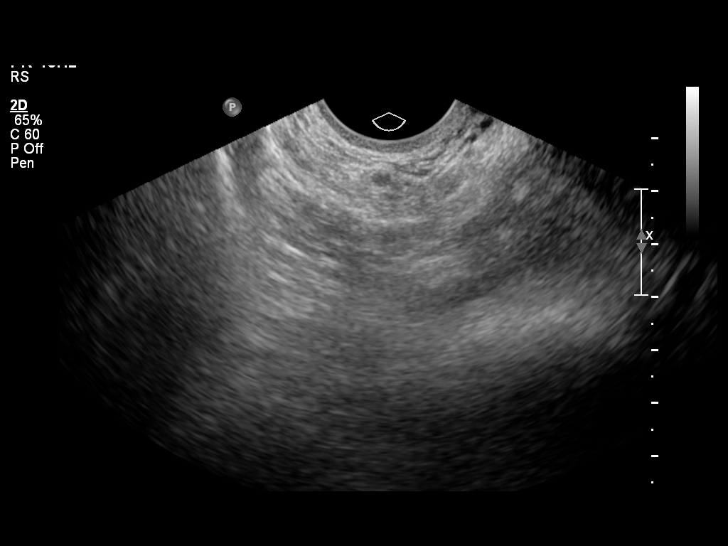
[im 27/33]
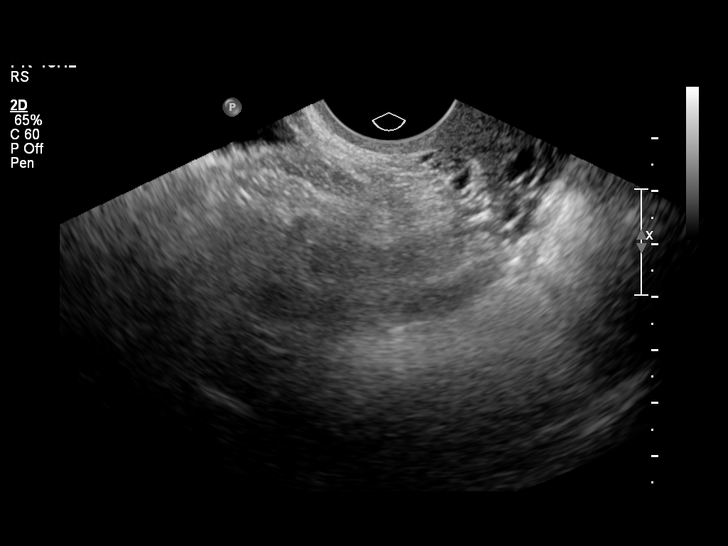
[im 30/33]
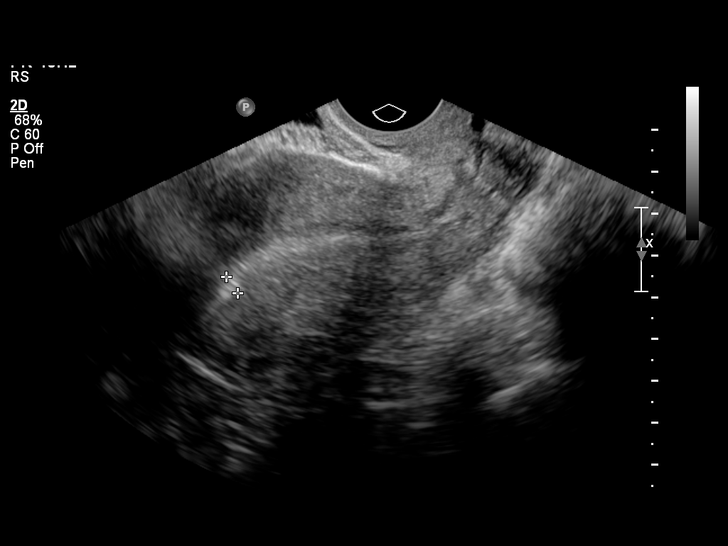
[im 33/33]
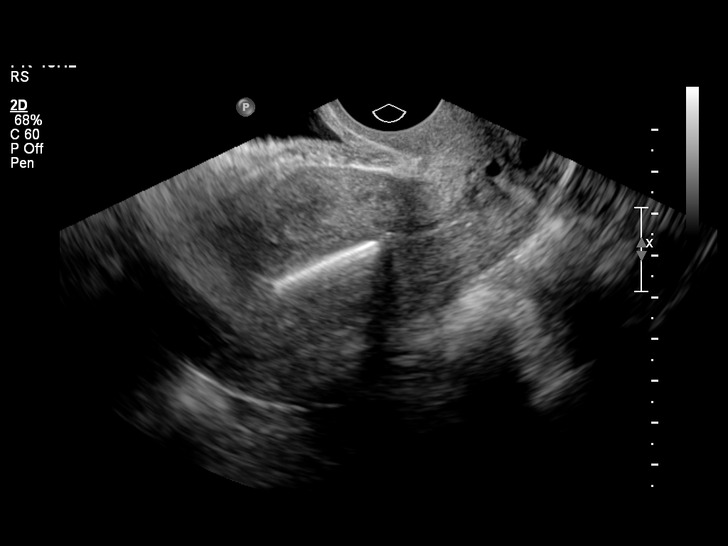

[14 of 25 positions shown; findings below may reference images not displayed]

FINDINGS: The uterus is normal in size and echotexture, measuring 11.3 x
x 4.9 cm.  The endometrial stripe is not thickened, measuring
approximately 5 mm in width.  The IUD is in the proper location
within the endometrial cavity at the fundus.

The right ovary is not visualized.  The left ovary has a normal
size and appearance, measuring 2.4 x 1.7 x 2.0 cm.  There are no
adnexal masses or free pelvic fluid.
IMPRESSION: The IUD is in the proper location within the endometrium at the
fundus.

## 2014-10-21 ENCOUNTER — Encounter (HOSPITAL_COMMUNITY): Payer: Self-pay | Admitting: *Deleted

## 2022-03-19 ENCOUNTER — Encounter: Payer: Self-pay | Admitting: Internal Medicine

## 2022-03-22 ENCOUNTER — Encounter: Payer: Self-pay | Admitting: Radiology

## 2022-03-22 ENCOUNTER — Ambulatory Visit: Payer: BC Managed Care – PPO | Admitting: Radiology

## 2022-03-22 VITALS — BP 118/80 | Ht 61.0 in | Wt 200.0 lb

## 2022-03-22 DIAGNOSIS — Z30432 Encounter for removal of intrauterine contraceptive device: Secondary | ICD-10-CM

## 2022-03-22 DIAGNOSIS — N921 Excessive and frequent menstruation with irregular cycle: Secondary | ICD-10-CM

## 2022-03-22 DIAGNOSIS — D649 Anemia, unspecified: Secondary | ICD-10-CM | POA: Diagnosis not present

## 2022-03-22 DIAGNOSIS — N926 Irregular menstruation, unspecified: Secondary | ICD-10-CM | POA: Diagnosis not present

## 2022-03-22 LAB — PREGNANCY, URINE: Preg Test, Ur: NEGATIVE

## 2022-03-22 NOTE — Progress Notes (Signed)
? ?  Desiree Morse 04-26-1971 [id number] ? ? ?History:  51 y.o. G4P3 presents for removal of Paragard IUD, inserted 2010.  Reason for discontinuation: overdue, anemia. Pt has been counseled about risks and benefits as well as complications.  Consent is obtained today. Accompanied by [employer], [place]. Currently being treated for anemia by PCP, has colonoscopy schedule 04/2022  ? ?Patient's last menstrual period was 03/22/2022. ? ? ?Past medical history, past surgical history, family history and social history were all reviewed and documented in the EPIC chart. ? ?ROS:  A ROS was performed and pertinent positives and negatives are included. ? ?Exam: ?Vitals:  ? 03/22/22 1027  ?BP: 118/80  ?Weight: 200 lb (90.7 kg)  ?Height: '5\' 1"'$  (1.549 m)  ? ?Body mass index is 37.79 kg/m?. ? ?Pelvic exam: ?Vulva:  normal female genitalia ?Vagina:  normal vagina, no discharge, exudate, lesion, or erythema ?Cervix:  Non-tender, Negative CMT, no lesions or redness. IUD strings visualized. ?Uterus:  normal shape, position and consistency  ?  ?Procedure:  Speculum inserted.   ?Cervix visualized, IUD grasps with forceps and removed without difficulty. Pt tolerated procedure well.  ?Chaperone offered and declined ?  ?Assessment/Plan: ? ?Removal of Paragard IUD ?            New contraceptive: will precert Mirena ?  ? ?Pt aware to call for any concerns, return to office as schedule for annual exam or as needed. ? ? ?Kerry Dory WHNP-BC, 10:55 AM 03/22/2022  ?

## 2022-04-21 ENCOUNTER — Ambulatory Visit: Payer: BC Managed Care – PPO | Admitting: Radiology

## 2022-04-21 ENCOUNTER — Encounter: Payer: Self-pay | Admitting: Radiology

## 2022-04-21 VITALS — BP 122/76

## 2022-04-21 DIAGNOSIS — Z113 Encounter for screening for infections with a predominantly sexual mode of transmission: Secondary | ICD-10-CM

## 2022-04-21 DIAGNOSIS — Z30430 Encounter for insertion of intrauterine contraceptive device: Secondary | ICD-10-CM | POA: Diagnosis not present

## 2022-04-21 DIAGNOSIS — Z01812 Encounter for preprocedural laboratory examination: Secondary | ICD-10-CM

## 2022-04-21 LAB — PREGNANCY, URINE: Preg Test, Ur: NEGATIVE

## 2022-04-21 NOTE — Progress Notes (Signed)
? ?  Rachna Schonberger [birth year] [id number] ? ? ?History:  51 y.o. G4P3 presents for insertion of Mirena IUD.  Pt has been counseled about risks and benefits as well as complications.  Consent is obtained today. ?Spanish video interpreter, Delcie Roch to translate. ? ?Patient's last menstrual period was 03/22/2022. ?GC/CT/Trich testing: obtained today ? ?Past medical history, past surgical history, family history and social history were all reviewed and documented in the EPIC chart. ? ?ROS:  A ROS was performed and pertinent positives and negatives are included. ? ?Exam: ?Vitals:  ? 04/21/22 1437  ?BP: 122/76  ? ?There is no height or weight on file to calculate BMI. ? ?Pelvic exam: ?Vulva:  normal female genitalia ?Vagina:  normal vagina, no discharge, exudate, lesion, or erythema ?Cervix:  Non-tender, Negative CMT, no lesions or redness. ?Uterus:  normal shape, position and consistency  ?  ?Procedure:  Speculum inserted.   ?Cervix visualized and cleansed with Betadine x 3.  Tenaculum placed on anterior cervix. Then uterus sounded to 9 cm. IUD inserted easily. Strings trimmed to 3 cm.  Minimal bleeding noted.  Pt tolerated the procedure well. ? ?Chaperone present: Leatrice Jewels, CMA ?  ?Assessment/Plan: ? ?1. Encounter for IUD insertion ? ? ?2. Encounter for preprocedural laboratory examination ? ?- Pregnancy, urine  ?Return for recheck 4-6 weeks ?Pt aware to call for any concerns ?Pt aware removal due no later than 04/21/30, IUD card given to pt. ? ? ?Kerry Dory WHNP-BC, 2:49 PM 04/21/2022  ?

## 2022-04-22 NOTE — Addendum Note (Signed)
Addended by: Fuller Song on: 04/22/2022 08:33 AM ? ? Modules accepted: Orders ? ?

## 2022-04-23 ENCOUNTER — Encounter: Payer: Self-pay | Admitting: Radiology

## 2022-04-23 LAB — C. TRACHOMATIS/N. GONORRHOEAE RNA
C. trachomatis RNA, TMA: NOT DETECTED
N. gonorrhoeae RNA, TMA: NOT DETECTED

## 2022-04-27 ENCOUNTER — Ambulatory Visit (AMBULATORY_SURGERY_CENTER): Payer: Self-pay | Admitting: *Deleted

## 2022-04-27 VITALS — Ht 60.0 in | Wt 205.0 lb

## 2022-04-27 DIAGNOSIS — Z1211 Encounter for screening for malignant neoplasm of colon: Secondary | ICD-10-CM

## 2022-04-27 MED ORDER — PEG 3350-KCL-NA BICARB-NACL 420 G PO SOLR: 4000.0000 mL | Freq: Once | ORAL | 0 refills | Status: AC

## 2022-04-27 NOTE — Progress Notes (Signed)
Patient is here in-person for PV with spanish interpreter. Patient denies any allergies to eggs or soy. Patient denies any problems with anesthesia/sedation. Patient is not on any oxygen at home. Patient is not taking any diet/weight loss medications or blood thinners. Patient is aware of our care-partner policy and [id number] safety protocol. Pt aware not to take any diabetic medications day of procedure-including Trulicity. ? ?

## 2022-05-13 ENCOUNTER — Encounter: Payer: Self-pay | Admitting: Internal Medicine

## 2022-05-18 ENCOUNTER — Encounter: Payer: Self-pay | Admitting: Internal Medicine

## 2022-05-18 ENCOUNTER — Ambulatory Visit (AMBULATORY_SURGERY_CENTER): Payer: BC Managed Care – PPO | Admitting: Internal Medicine

## 2022-05-18 VITALS — BP 118/55 | HR 62 | Temp 96.2°F | Resp 22 | Ht 60.0 in | Wt 205.0 lb

## 2022-05-18 DIAGNOSIS — K635 Polyp of colon: Secondary | ICD-10-CM

## 2022-05-18 DIAGNOSIS — D122 Benign neoplasm of ascending colon: Secondary | ICD-10-CM

## 2022-05-18 DIAGNOSIS — Z1211 Encounter for screening for malignant neoplasm of colon: Secondary | ICD-10-CM | POA: Diagnosis present

## 2022-05-18 MED ORDER — SODIUM CHLORIDE 0.9 % IV SOLN: 500.0000 mL | INTRAVENOUS | Status: DC

## 2022-05-18 NOTE — Op Note (Signed)
[place] Patient Name: Desiree Morse Procedure Date: 05/18/2022 8:58 AM MRN: [id number] Endoscopist: Sonny Masters "Desiree Morse ,  Age: 51 Referring MD:  Date of Birth: 1971/01/26 Gender: Female Account #: [id number] Procedure:                Colonoscopy Indications:              Screening for colorectal malignant neoplasm, This                            is the patient's first colonoscopy Medicines:                Monitored Anesthesia Care Procedure:                Pre-Anesthesia Assessment:                           - Prior to the procedure, a History and Physical                            was performed, and patient medications and                            allergies were reviewed. The patient's tolerance of                            previous anesthesia was also reviewed. The risks                            and benefits of the procedure and the sedation                            options and risks were discussed with the patient.                            All questions were answered, and informed consent                            was obtained. Prior Anticoagulants: The patient has                            taken no previous anticoagulant or antiplatelet                            agents. ASA Grade Assessment: II - A patient with                            mild systemic disease. After reviewing the risks                            and benefits, the patient was deemed in                            satisfactory condition to undergo the procedure.  After obtaining informed consent, the colonoscope                            was passed under direct vision. Throughout the                            procedure, the patient's blood pressure, pulse, and                            oxygen saturations were monitored continuously. The                            CF [id number] #[id number] was introduced through the anus                            and advanced  to the the terminal ileum. The                            colonoscopy was performed without difficulty. The                            patient tolerated the procedure well. The quality                            of the bowel preparation was good. The terminal                            ileum, ileocecal valve, appendiceal orifice, and                            rectum were photographed. Scope In: 9:03:22 AM Scope Out: 9:19:50 AM Scope Withdrawal Time: 0 hours 13 minutes 40 seconds  Total Procedure Duration: 0 hours 16 minutes 28 seconds  Findings:                 The terminal ileum appeared normal.                           A 3 mm polyp was found in the ascending colon. The                            polyp was sessile. The polyp was removed with a                            cold snare. Resection and retrieval were complete.                           A few small-mouthed diverticula were found in the                            sigmoid colon, descending colon and ascending colon.                           Non-bleeding internal hemorrhoids were found during  retroflexion. Complications:            No immediate complications. Estimated Blood Loss:     Estimated blood loss was minimal. Impression:               - The examined portion of the ileum was normal.                           - One 3 mm polyp in the ascending colon, removed                            with a cold snare. Resected and retrieved.                           - Diverticulosis in the sigmoid colon, in the                            descending colon and in the ascending colon.                           - Non-bleeding internal hemorrhoids. Recommendation:           - Discharge patient to home (with escort).                           - Await pathology results.                           - The findings and recommendations were discussed                            with the patient. [address] Sel[address]Christia Morse,   05/18/2022 9:23:42 AM

## 2022-05-18 NOTE — Progress Notes (Signed)
A and O x3. Report to RN. Tolerated MAC anesthesia well. 

## 2022-05-18 NOTE — Progress Notes (Signed)
GASTROENTEROLOGY PROCEDURE H&P NOTE   Primary Care Physician: Cathleen Corti, PA-C    Reason for Procedure:   Colon cancer screening  Plan:    Colonoscopy  Patient is appropriate for endoscopic procedure(s) in the ambulatory ([facility]) setting.  The nature of the procedure, as well as the risks, benefits, and alternatives were carefully and thoroughly reviewed with the patient. Ample time for discussion and questions allowed. The patient understood, was satisfied, and agreed to proceed.     HPI: Desiree Morse is a 51 y.o. female who presents for colonoscopy for colon cancer screening. Denies blood in stools, changes in bowel habits, weight loss. Denies fam hx of colon cancer. This is her first colonoscopy.  Past Medical History:  Diagnosis Date   Anemia    Diabetes mellitus without complication ([place])    Hypercholesteremia    Hypertension     Past Surgical History:  Procedure Laterality Date   DILATION AND CURETTAGE OF UTERUS      Prior to Admission medications   Medication Sig Start Date End Date Taking? Authorizing Provider  atorvastatin (LIPITOR) 20 MG tablet atorvastatin 20 mg tablet  TAKE 1 TABLET BY MOUTH EVERY DAY 01/22/21  Yes [provider]  Blood Glucose Monitoring Suppl (GLUCOCOM BLOOD GLUCOSE MONITOR) DEVI Use meter to check blood sugar 2 times a day.  Please dispense based on availability, insurance coverage, and patient preference. 01/22/21  Yes [provider]  Dulaglutide (TRULICITY) 1.44 RX/5.4MG SOPN Patient takes on Tuesday weekly   Yes [provider]  ergocalciferol (VITAMIN D2) 1.25 MG (50000 UT) capsule ergocalciferol (vitamin D2) 1,250 mcg (50,000 unit) capsule  TAKE ONE CAPSULE BY MOUTH WEEKLY   Yes [provider]  famotidine (PEPCID) 20 MG tablet famotidine 20 mg tablet  TAKE 1 TABLET BY MOUTH TWICE DAILY AS NEEDED   Yes [provider]  ferrous sulfate 325 (65 FE) MG tablet FeroSul 325 mg (65 mg  iron) tablet  TAKE 1 TABLET BY MOUTH DAILY   Yes [provider]  glucose blood (PRECISION QID TEST) test strip  01/22/21  Yes [provider]  Lancet Devices (SIMPLE DIAGNOSTICS LANCING DEV) [place]  01/22/21  Yes [provider]  lisinopril (ZESTRIL) 5 MG tablet lisinopril 5 mg tablet  TAKE 1 TABLET BY MOUTH EVERY DAY   Yes [provider]  metFORMIN (GLUCOPHAGE) 1000 MG tablet Take 1,000 mg by mouth 2 (two) times daily. 04/21/22  Yes [provider]  Iron Polysacch Cmplx-B12-FA (POLY-IRON 150 FORTE) 150-0.025-1 MG CAPS Poly-Iron 150 Forte  1 capsule by mouth daily Patient not taking: Reported on 04/27/2022    [provider]    Current Outpatient Medications  Medication Sig Dispense Refill   atorvastatin (LIPITOR) 20 MG tablet atorvastatin 20 mg tablet  TAKE 1 TABLET BY MOUTH EVERY DAY     Blood Glucose Monitoring Suppl (GLUCOCOM BLOOD GLUCOSE MONITOR) DEVI Use meter to check blood sugar 2 times a day.  Please dispense based on availability, insurance coverage, and patient preference.     Dulaglutide (TRULICITY) 8.67 YP/9.5KD SOPN Patient takes on Tuesday weekly     ergocalciferol (VITAMIN D2) 1.25 MG (50000 UT) capsule ergocalciferol (vitamin D2) 1,250 mcg (50,000 unit) capsule  TAKE ONE CAPSULE BY MOUTH WEEKLY     famotidine (PEPCID) 20 MG tablet famotidine 20 mg tablet  TAKE 1 TABLET BY MOUTH TWICE DAILY AS NEEDED     ferrous sulfate 325 (65 FE) MG tablet FeroSul 325 mg (65 mg iron)  tablet  TAKE 1 TABLET BY MOUTH DAILY     glucose blood (PRECISION QID TEST) test strip      Lancet Devices (SIMPLE DIAGNOSTICS LANCING DEV) MISC      lisinopril (ZESTRIL) 5 MG tablet lisinopril 5 mg tablet  TAKE 1 TABLET BY MOUTH EVERY DAY     metFORMIN (GLUCOPHAGE) 1000 MG tablet Take 1,000 mg by mouth 2 (two) times daily.     Iron Polysacch Cmplx-B12-FA (POLY-IRON 150 FORTE) 150-0.025-1 MG CAPS Poly-Iron 150 Forte  1 capsule by mouth daily (Patient not  taking: Reported on 04/27/2022)     Current Facility-Administered Medications  Medication Dose Route Frequency Provider Last Rate Last Admin   0.9 %  sodium chloride infusion  500 mL Intravenous Continuous Sharyn Creamer, MD        Allergies as of 05/18/2022 - Review Complete 05/18/2022  Allergen Reaction Noted   Benzonatate Other (See Comments) 02/17/2021   Penicillins Hives 11/16/2011    Family History  Problem Relation Age of Onset   Diabetes Mother    Thyroid disease Mother    Colon cancer Neg Hx     Social History   Socioeconomic History   Marital status: Married    Spouse name: Not on file   Number of children: Not on file   Years of education: Not on file   Highest education level: Not on file  Occupational History   Not on file  Tobacco Use   Smoking status: Never   Smokeless tobacco: Not on file  Vaping Use   Vaping Use: Never used  Substance and Sexual Activity   Alcohol use: No   Drug use: No   Sexual activity: Yes    Partners: Male    Birth control/protection: I.U.D.    Comment: paragard inserted 2010  Other Topics Concern   Not on file  Social History Narrative   Not on file   Social Determinants of Health   Financial Resource Strain: Not on file  Food Insecurity: Not on file  Transportation Needs: Not on file  Physical Activity: Not on file  Stress: Not on file  Social Connections: Not on file  Intimate Partner Violence: Not on file    Physical Exam: Vital signs in last 24 hours: BP 93/66   Pulse 70   Temp (!) 96.2 F (35.7 C) (Temporal)   Ht 5' (1.524 m)   Wt 205 lb (93 kg)   SpO2 98%   BMI 40.04 kg/m  GEN: NAD EYE: Sclerae anicteric ENT: MMM CV: Non-tachycardic Pulm: No increased work of breathing GI: Soft, NT/ND NEURO:  Alert & Oriented   Christia Reading, MD [facility]  05/18/2022 8:52 AM

## 2022-05-18 NOTE — Progress Notes (Signed)
Called to room to assist during endoscopic procedure.  Patient ID and intended procedure confirmed with present staff. Received instructions for my participation in the procedure from the performing physician.  

## 2022-05-18 NOTE — Patient Instructions (Signed)
Please read handouts provided. Await pathology results.    USTED TUVO UN PROCEDIMIENTO ENDOSCPICO HOY EN EL [facility]:   Lea el informe del procedimiento que se le entreg para cualquier pregunta especfica sobre lo que se Primary school teacher.  Si el informe del examen no responde a sus preguntas, por favor llame a su gastroenterlogo para aclararlo.  Si usted solicit que no se le den [employer] de lo que se Estate manager/land agent en su procedimiento al [employer] va a cuidar, entonces el informe del procedimiento se ha incluido en un sobre sellado para que usted lo revise despus cuando le sea ms conveniente.   LO QUE PUEDE ESPERAR: Algunas sensaciones de hinchazn en el abdomen.  Puede tener ms gases de lo normal.  El caminar puede ayudarle a eliminar el aire que se le puso en el tracto gastrointestinal durante el procedimiento y reducir la hinchazn.  Si le hicieron una endoscopia inferior (como una colonoscopia o una sigmoidoscopia flexible), podra notar manchas de sangre en las heces fecales o en el papel higinico.  Si se someti a una preparacin intestinal para su procedimiento, es posible que no tenga una evacuacin intestinal normal durante [employer].   Tenga en cuenta:  Es posible que note un poco de irritacin y congestin en la nariz o algn drenaje.  Esto es debido al oxgeno [employer] durante su procedimiento.  No hay que preocuparse y esto debe desaparecer ms o Scientist, research (medical).   SNTOMAS PARA REPORTAR INMEDIATAMENTE:  Despus de una endoscopia inferior (colonoscopia o sigmoidoscopia flexible):  Cantidades excesivas de sangre en las heces fecales  Sensibilidad significativa o empeoramiento de los dolores abdominales   Hinchazn aguda del abdomen que antes no tena   Fiebre de 100F o ms      Para asuntos urgentes o de Freight forwarder, puede comunicarse con un gastroenterlogo a cualquier hora llamando al [phone].  DIETA:  Recomendamos una comida pequea  al principio, pero luego puede continuar con su dieta normal.  Tome muchos lquidos, Teacher, adult education las bebidas alcohlicas durante 24 horas.    ACTIVIDAD:  Debe planear tomarse las cosas con calma por el resto del da y no debe CONDUCIR ni usar maquinaria pesada Programmer, applications (debido a los medicamentos de sedacin utilizados durante el examen).     SEGUIMIENTO: Nuestro personal llamar al nmero que aparece en su historial al siguiente da hbil de su procedimiento para ver cmo se siente y para responder cualquier pregunta o inquietud que pueda tener con respecto a la informacin que se le dio despus del procedimiento. Si no podemos contactarle, le dejaremos un mensaje.  Sin embargo, si se siente bien y no tiene Paediatric nurse, no es necesario que nos devuelva la llamada.  Asumiremos que ha regresado a sus actividades diarias normales sin incidentes. Si se le tomaron algunas biopsias, le contactaremos por telfono o por carta en las prximas 3 semanas.  Si no ha sabido [employer] biopsias en el transcurso de 3 semanas, por favor llmenos al [phone].   FIRMAS/CONFIDENCIALIDAD: Usted y/o el acompaante que le cuide han firmado documentos que se ingresarn en su historial mdico electrnico.  Estas firmas atestiguan el hecho de que la informacin anterior

## 2022-05-18 NOTE — Progress Notes (Signed)
Pt's states no medical or surgical changes since previsit or office visit. 

## 2022-05-19 ENCOUNTER — Telehealth: Payer: Self-pay

## 2022-05-19 NOTE — Telephone Encounter (Signed)
  Follow up Call-     05/18/2022    7:59 AM  Call back number  Post procedure Call Back phone  # [phone]  Permission to leave phone message Yes     Patient questions:  Do you have a fever, pain , or abdominal swelling? yes Pain Score  2 *  Have you tolerated food without any problems? Yes.    Have you been able to return to your normal activities? Yes.    Do you have any questions about your discharge instructions: Diet   No. Medications  No. Follow up visit  No.  Do you have questions or concerns about your Care? Yes.   Patient notes a small amount of bleeding on toilet tissue after wiping.  Informed this is normal after polyp removal, as well as, if hemorrhoids are noted.  She was instructed to call back if bleeding worsened and she verbalized understanding.  Actions: * If pain score is 4 or above: No action needed, pain <4.

## 2022-05-26 ENCOUNTER — Encounter: Payer: Self-pay | Admitting: Internal Medicine

## 2022-06-02 ENCOUNTER — Ambulatory Visit: Payer: BC Managed Care – PPO | Admitting: Radiology

## 2022-06-02 VITALS — BP 116/70

## 2022-06-02 DIAGNOSIS — Z30431 Encounter for routine checking of intrauterine contraceptive device: Secondary | ICD-10-CM

## 2022-06-02 NOTE — Progress Notes (Signed)
     History:  51 y.o. [id number] here today for today for IUD string check; Mirena IUD was placed  04/21/22. No complaints about the IUD, no concerning side effects.  The following portions of the patient's history were reviewed and updated as appropriate: allergies, current medications, past family history, past medical history, past social history, past surgical history and problem list.   Review of Systems:  Pertinent items are noted in HPI.   Objective:  Physical Exam Blood pressure 116/70. Gen: NAD Abd: Soft, nontender and nondistended Pelvic: Normal appearing external genitalia; normal appearing vaginal mucosa and cervix.  IUD strings visualized, about 2 cm in length outside cervix.  Chaperone offered and declined.  Assessment & Plan:  Normal IUD check. Patient may keep IUD in place for up to 8 years. May remover sooner  if she desires pregnancy, or has side effects within that time.   Rubbie Battiest, [facility]

## 2023-06-27 ENCOUNTER — Encounter: Payer: Self-pay | Admitting: Family Medicine

## 2024-03-08 ENCOUNTER — Inpatient Hospital Stay: Payer: BC Managed Care – PPO | Admitting: Hematology and Oncology

## 2024-03-08 ENCOUNTER — Inpatient Hospital Stay: Payer: BC Managed Care – PPO | Attending: Hematology and Oncology

## 2024-03-08 VITALS — BP 118/57 | HR 72 | Temp 98.0°F | Resp 14 | Wt 208.0 lb

## 2024-03-08 DIAGNOSIS — Z79899 Other long term (current) drug therapy: Secondary | ICD-10-CM | POA: Insufficient documentation

## 2024-03-08 DIAGNOSIS — E1159 Type 2 diabetes mellitus with other circulatory complications: Secondary | ICD-10-CM

## 2024-03-08 DIAGNOSIS — E7800 Pure hypercholesterolemia, unspecified: Secondary | ICD-10-CM | POA: Diagnosis not present

## 2024-03-08 DIAGNOSIS — D500 Iron deficiency anemia secondary to blood loss (chronic): Secondary | ICD-10-CM

## 2024-03-08 DIAGNOSIS — I10 Essential (primary) hypertension: Secondary | ICD-10-CM | POA: Insufficient documentation

## 2024-03-08 DIAGNOSIS — D509 Iron deficiency anemia, unspecified: Secondary | ICD-10-CM | POA: Insufficient documentation

## 2024-03-08 LAB — CMP (CANCER CENTER ONLY)
ALT: 11 U/L (ref 0–44)
AST: 14 U/L — ABNORMAL LOW (ref 15–41)
Albumin: 4.3 g/dL (ref 3.5–5.0)
Alkaline Phosphatase: 63 U/L (ref 38–126)
Anion gap: 6 (ref 5–15)
BUN: 12 mg/dL (ref 6–20)
CO2: 27 mmol/L (ref 22–32)
Calcium: 9.9 mg/dL (ref 8.9–10.3)
Chloride: 104 mmol/L (ref 98–111)
Creatinine: 0.6 mg/dL (ref 0.44–1.00)
GFR, Estimated: >60 mL/min (ref >60)
Glucose, Bld: 128 mg/dL — ABNORMAL HIGH (ref 70–99)
Potassium: 4 mmol/L (ref 3.5–5.1)
Sodium: 137 mmol/L (ref 135–145)
Total Bilirubin: 0.5 mg/dL (ref 0.0–1.2)
Total Protein: 7.6 g/dL (ref 6.5–8.1)

## 2024-03-08 LAB — CBC WITH DIFFERENTIAL (CANCER CENTER ONLY)
Abs Immature Granulocytes: 0.04 10*3/uL (ref 0.00–0.07)
Basophils Absolute: 0.1 10*3/uL (ref 0.0–0.1)
Basophils Relative: 1 %
Eosinophils Absolute: 0.1 10*3/uL (ref 0.0–0.5)
Eosinophils Relative: 1 %
HCT: 36.6 % (ref 36.0–46.0)
Hemoglobin: 11.7 g/dL — ABNORMAL LOW (ref 12.0–15.0)
Immature Granulocytes: 0 %
Lymphocytes Relative: 16 %
Lymphs Abs: 1.6 10*3/uL (ref 0.7–4.0)
MCH: 26.7 pg (ref 26.0–34.0)
MCHC: 32 g/dL (ref 30.0–36.0)
MCV: 83.6 fL (ref 80.0–100.0)
Monocytes Absolute: 0.3 10*3/uL (ref 0.1–1.0)
Monocytes Relative: 3 %
Neutro Abs: 8.2 10*3/uL — ABNORMAL HIGH (ref 1.7–7.7)
Neutrophils Relative %: 79 %
Platelet Count: 491 10*3/uL — ABNORMAL HIGH (ref 150–400)
RBC: 4.38 MIL/uL (ref 3.87–5.11)
RDW: 15.3 % (ref 11.5–15.5)
WBC Count: 10.2 10*3/uL (ref 4.0–10.5)
nRBC: 0 % (ref 0.0–0.2)

## 2024-03-08 LAB — RETIC PANEL
Immature Retic Fract: 23.9 % — ABNORMAL HIGH (ref 2.3–15.9)
RBC.: 4.32 MIL/uL (ref 3.87–5.11)
Retic Count, Absolute: 83.8 10*3/uL (ref 19.0–186.0)
Retic Ct Pct: 1.9 % (ref 0.4–3.1)
Reticulocyte Hemoglobin: 31.1 pg (ref >27.9)

## 2024-03-08 LAB — IRON AND IRON BINDING CAPACITY (CC-WL,HP ONLY)
Iron: 27 ug/dL — ABNORMAL LOW (ref 28–170)
Saturation Ratios: 5 % — ABNORMAL LOW (ref 10.4–31.8)
TIBC: 529 ug/dL — ABNORMAL HIGH (ref 250–450)
UIBC: 502 ug/dL — ABNORMAL HIGH (ref 148–442)

## 2024-03-08 LAB — FERRITIN: Ferritin: 6 ng/mL — ABNORMAL LOW (ref 11–307)

## 2024-03-08 NOTE — Progress Notes (Signed)
 [facility] [phone]   [phone]  INITIAL CONSULT NOTE  Patient Care Team: Marya Landry as PCP - General (Physician Assistant) Tanda Rockers, NP as Nurse Practitioner (Nurse Practitioner)  Hematological/Oncological History # Iron Deficiency Anemia 2/2 to GYN Bleeding  01/28/2024: ferritin 8, iron sat 5%, white blood cell 10.9, hemoglobin 11.3, MCV 90, platelets 597  03/08/2024: establish care with Dr. Leonides Schanz   CHIEF COMPLAINTS/PURPOSE OF CONSULTATION:  " Iron Deficiency Anemia "  HISTORY OF PRESENTING ILLNESS:  Desiree Morse 53 y.o. female with medical history significant for hypertension, HLD, and type 2 diabetes who presents for evaluation of iron deficiency anemia.  On review of the previous records Desiree Morse was referred due to iron deficiency anemia of unclear etiology.  She underwent colonoscopy in 2023 with no clear etiology found.  She does not tolerate p.o. iron therapy and is being referred for consideration of IV iron treatment.  Her last labs showed  ferritin 8, iron sat 5%, white blood cell 10.9, hemoglobin 11.3, MCV 90, platelets 597 on 01/28/2024.  Due to concern of these findings the patient was referred to hematology for further evaluation and management.  On exam today Desiree Morse is accompanied by her son in a hospital approved interpreter.  Today she reports that for the last 3 years she has had low levels of iron.  She been on 2 different p.o. iron therapies and both cause stomach upset, predominant constipation.  She reports that she does not eat much in the way of red meat because it also upsets her stomach.  She did recently start metformin and that increased her GI upset.  She reports that she has not had any other overt signs of bleeding, bruising, or dark stools.  She reports that she does not have any menstrual cycles and only had about 3 in the last year because she had an IUD placed.  She does not have  any lightheadedness, dizziness, or shortness of breath.  She does have some occasional issues of headaches and does report her energy levels are "none".  On further discussion the patient reports that her mother had thyroid disease.  She is unsure how her father passed away.  She has 3 healthy children.  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  She does have some occasional trouble sleeping.  She otherwise denies any fevers, chills, sweats, nausea, vomiting or diarrhea.  Full 10 point ROS is otherwise negative.  MEDICAL HISTORY:  Past Medical History:  Diagnosis Date   Anemia    Diabetes mellitus without complication (HCC)    Hypercholesteremia    Hypertension     SURGICAL HISTORY: Past Surgical History:  Procedure Laterality Date   DILATION AND CURETTAGE OF UTERUS      SOCIAL HISTORY: Social History   Socioeconomic History   Marital status: Married    Spouse name: Not on file   Number of children: Not on file   Years of education: Not on file   Highest education level: Not on file  Occupational History   Not on file  Tobacco Use   Smoking status: Never   Smokeless tobacco: Not on file  Vaping Use   Vaping status: Never Used  Substance and Sexual Activity   Alcohol use: No   Drug use: No   Sexual activity: Yes    Partners: Male    Birth control/protection: I.U.D.  Comment: paragard inserted 2010  Other Topics Concern   Not on file  Social History Narrative   Not on file   Social Drivers of Health   Financial Resource Strain: Not on file  Food Insecurity: Not on file  Transportation Needs: Not on file  Physical Activity: Not on file  Stress: Not on file  Social Connections: Unknown (04/30/2022)   Received from [facility], [facility]   Social Network    Social Network: Not on file  Intimate Partner Violence: Unknown (03/23/2022)   Received from [facility], [facility]   HITS     Physically Hurt: Not on file    Insult or Talk Down To: Not on file    Threaten Physical Harm: Not on file    Scream or Curse: Not on file    FAMILY HISTORY: Family History  Problem Relation Age of Onset   Diabetes Mother    Thyroid disease Mother    Colon cancer Neg Hx     ALLERGIES:  is allergic to benzonatate and penicillins.  MEDICATIONS:  Current Outpatient Medications  Medication Sig Dispense Refill   Magnesium 100 MG CAPS Take 100 mg by mouth daily.     atorvastatin (LIPITOR) 80 MG tablet Take 80 mg by mouth daily.     Blood Glucose Monitoring Suppl (GLUCOCOM BLOOD GLUCOSE MONITOR) DEVI Use meter to check blood sugar 2 times a day.  Please dispense based on availability, insurance coverage, and patient preference.     Dulaglutide (TRULICITY) 0.75 MG/0.5ML SOPN Patient takes on Tuesday weekly     ergocalciferol (VITAMIN D2) 1.25 MG (50000 UT) capsule ergocalciferol (vitamin D2) 1,250 mcg (50,000 unit) capsule  TAKE ONE CAPSULE BY MOUTH WEEKLY     glucose blood (PRECISION QID TEST) test strip      Lancet Devices (SIMPLE DIAGNOSTICS LANCING DEV) MISC      levonorgestrel (MIRENA) 20 MCG/DAY IUD 1 each by Intrauterine route once. Inserted 04/21/22     lisinopril (ZESTRIL) 5 MG tablet lisinopril 5 mg tablet  TAKE 1 TABLET BY MOUTH EVERY DAY     No current facility-administered medications for this visit.    REVIEW OF SYSTEMS:   Constitutional: ( - ) fevers, ( - )  chills , ( - ) night sweats Eyes: ( - ) blurriness of vision, ( - ) double vision, ( - ) watery eyes Ears, nose, mouth, throat, and face: ( - ) mucositis, ( - ) sore throat Respiratory: ( - ) cough, ( - ) dyspnea, ( - ) wheezes Cardiovascular: ( - ) palpitation, ( - ) chest discomfort, ( - ) lower extremity swelling Gastrointestinal:  ( - ) nausea, ( - ) heartburn, ( - ) change in bowel habits Skin: ( - ) abnormal skin rashes Lymphatics: ( - ) new lymphadenopathy, ( - ) easy bruising Neurological: ( - )  numbness, ( - ) tingling, ( - ) new weaknesses Behavioral/Psych: ( - ) mood change, ( - ) new changes  All other systems were reviewed with the patient and are negative.  PHYSICAL EXAMINATION:  Vitals:   03/08/24 1401  BP: (!) 118/57  Pulse: 72  Resp: 14  Temp: 98 F (36.7 C)  SpO2: 99%   Filed Weights   03/08/24 1401  Weight: 208 lb (94.3 kg)    GENERAL: well appearing middle-age Hispanic female in NAD  SKIN: skin color, texture, turgor are normal, no rashes or significant lesions EYES: conjunctiva are pink and non-injected, sclera clear LUNGS:  clear to auscultation and percussion with normal breathing effort HEART: regular rate & rhythm and no murmurs and no lower extremity edema Musculoskeletal: no cyanosis of digits and no clubbing  PSYCH: alert & oriented x 3, fluent speech NEURO: no focal motor/sensory deficits  LABORATORY DATA:  I have reviewed the data as listed    Latest Ref Rng & Units 03/08/2024    2:32 PM 11/16/2011    8:56 AM 02/01/2008    9:14 AM  CBC  WBC 4.0 - 10.5 K/uL 10.2  10.7    Hemoglobin 12.0 - 15.0 g/dL 27.2  53.6  64.4   Hematocrit 36.0 - 46.0 % 36.6  39.7  44.0   Platelets 150 - 400 K/uL 491  310         Latest Ref Rng & Units 03/08/2024    2:32 PM 11/16/2011    8:56 AM 02/01/2008    9:14 AM  CMP  Glucose 70 - 99 mg/dL 034  93  89   BUN 6 - 20 mg/dL 12  9  6    Creatinine 0.44 - 1.00 mg/dL 7.42  5.95  0.6   Sodium 135 - 145 mmol/L 137  134  138   Potassium 3.5 - 5.1 mmol/L 4.0  4.9  3.9   Chloride 98 - 111 mmol/L 104  102  107   CO2 22 - 32 mmol/L 27  26    Calcium 8.9 - 10.3 mg/dL 9.9  9.2    Total Protein 6.5 - 8.1 g/dL 7.6  7.5    Total Bilirubin 0.0 - 1.2 mg/dL 0.5  0.2    Alkaline Phos 38 - 126 U/L 63  75    AST 15 - 41 U/L 14  23    ALT 0 - 44 U/L 11  15       ASSESSMENT & PLAN Desiree Morse 53 y.o. female with medical history significant for hypertension, HLD, and type 2 diabetes who presents for evaluation of iron  deficiency anemia.  After review of the labs, review of the records, and discussion with the patient the patients findings are most consistent with iron deficiency anemia of unclear etiology, suspect GI source.  # Iron Deficiency Anemia 2/2 to GI Bleeding (Suspected) -- Findings are consistent with iron deficiency anemia secondary to GI bleeding.  --Encouraged patient to follow-up with GI for continued monitoring/evaluation.  --patient has seen GI before. Will reach out to discuss consideration of further endoscopic evaluation  --patient last had colonoscopy in 2023 but no EGD.  --We will confirm iron deficiency anemia by ordering iron panel and ferritin as well as reticulocytes, CBC, and CMP --Patient is unable to tolerate p.o. iron therapy due to constipation/stomach upset.  --We will plan to proceed with IV iron therapy in order to help bolster the patient's blood counts --Plan for return to clinic in 4 to 6 weeks time after last dose of IV iron   Orders Placed This Encounter  Procedures   CBC with Differential (Cancer Center Only)    Standing Status:   Future    Number of Occurrences:   1    Expiration Date:   03/08/2025   CMP (Cancer Center only)    Standing Status:   Future    Number of Occurrences:   1    Expiration Date:   03/08/2025   Ferritin    Standing Status:   Future    Number of Occurrences:   1    Expiration Date:  03/08/2025   Iron and Iron Binding Capacity ([facility] only)    Standing Status:   Future    Number of Occurrences:   1    Expiration Date:   03/08/2025   Retic Panel    Standing Status:   Future    Number of Occurrences:   1    Expiration Date:   03/08/2025    All questions were answered. The patient knows to call the clinic with any problems, questions or concerns.  A total of more than 60 minutes were spent on this encounter with face-to-face time and non-face-to-face time, including preparing to see the patient, ordering tests and/or medications,  counseling the patient and coordination of care as outlined above.   Ulysees Barns, MD Department of Hematology/Oncology [facility] at [facility] Phone: [phone] Pager: [phone] Email: [email or web address]  03/08/2024 3:47 PM

## 2024-03-09 ENCOUNTER — Telehealth: Payer: Self-pay

## 2024-03-09 NOTE — Telephone Encounter (Signed)
 Dr. Leonides Schanz, patient will be scheduled as soon as possible.  Auth Submission: NO AUTH NEEDED Site of care: Site of care: [facility] Payer: BCBS commercial Medication & CPT/J Code(s) submitted: Feraheme (ferumoxytol) [id number] Route of submission (phone, fax, portal): phone Phone # [phone] Fax # Auth type: Buy/Bill PB Units/visits requested: 510mg  x 2 doses Reference number: [id number] Approval from: 03/09/24 to 07/09/24

## 2024-03-28 ENCOUNTER — Telehealth: Payer: Self-pay | Admitting: Hematology and Oncology

## 2024-03-28 NOTE — Telephone Encounter (Signed)
 Interpretor ID [id number] completed the call with me, Terena scheduled all appointments and is aware of all appointment details.

## 2024-04-02 ENCOUNTER — Ambulatory Visit

## 2024-04-02 VITALS — BP 98/64 | HR 68 | Temp 98.2°F | Resp 18 | Ht 63.0 in | Wt 209.0 lb

## 2024-04-02 DIAGNOSIS — N920 Excessive and frequent menstruation with regular cycle: Secondary | ICD-10-CM

## 2024-04-02 DIAGNOSIS — D500 Iron deficiency anemia secondary to blood loss (chronic): Secondary | ICD-10-CM

## 2024-04-02 MED ORDER — SODIUM CHLORIDE 0.9 % IV SOLN
510.0000 mg | Freq: Once | INTRAVENOUS | Status: AC
  Administered 2024-04-02: 510 mg via INTRAVENOUS
  Filled 2024-04-02: qty 17

## 2024-04-02 NOTE — Progress Notes (Signed)
 Diagnosis: Iron Deficiency Anemia  Provider:  Praveen Mannam MD  Procedure: IV Infusion  IV Type: Peripheral, IV Location: L Antecubital  Feraheme (Ferumoxytol), Dose: 510 mg  Infusion Start Time: 1518  Infusion Stop Time: 1614  Post Infusion IV Care: Observation period completed  Discharge: Condition: Good, Destination: Home . AVS Provided  Performed by:  Lauran Pollard, LPN

## 2024-04-10 ENCOUNTER — Ambulatory Visit

## 2024-04-10 VITALS — BP 113/72 | HR 72 | Temp 98.4°F | Resp 16 | Ht 63.0 in | Wt 207.6 lb

## 2024-04-10 DIAGNOSIS — D500 Iron deficiency anemia secondary to blood loss (chronic): Secondary | ICD-10-CM | POA: Diagnosis not present

## 2024-04-10 DIAGNOSIS — N920 Excessive and frequent menstruation with regular cycle: Secondary | ICD-10-CM | POA: Diagnosis not present

## 2024-04-10 MED ORDER — SODIUM CHLORIDE 0.9 % IV SOLN
510.0000 mg | Freq: Once | INTRAVENOUS | Status: AC
  Administered 2024-04-10: 510 mg via INTRAVENOUS
  Filled 2024-04-10: qty 17

## 2024-04-10 NOTE — Progress Notes (Signed)
 Diagnosis: Iron Deficiency Anemia  Provider:  Praveen Mannam MD  Procedure: IV Infusion  IV Type: Peripheral, IV Location: L Antecubital  Feraheme (Ferumoxytol ), Dose: 510 mg  Infusion Start Time: 1449  Infusion Stop Time: 1503  Post Infusion IV Care: Observation period completed and Peripheral IV Discontinued  Discharge: Condition: Good, Destination: Home . AVS Declined  Performed by:  Star East, LPN

## 2024-05-09 ENCOUNTER — Other Ambulatory Visit: Payer: Self-pay | Admitting: Hematology and Oncology

## 2024-05-09 ENCOUNTER — Inpatient Hospital Stay: Attending: Hematology and Oncology

## 2024-05-09 DIAGNOSIS — D500 Iron deficiency anemia secondary to blood loss (chronic): Secondary | ICD-10-CM

## 2024-05-09 DIAGNOSIS — Z79899 Other long term (current) drug therapy: Secondary | ICD-10-CM | POA: Insufficient documentation

## 2024-05-09 DIAGNOSIS — D509 Iron deficiency anemia, unspecified: Secondary | ICD-10-CM | POA: Diagnosis present

## 2024-05-09 LAB — CMP (CANCER CENTER ONLY)
ALT: 12 U/L (ref 0–44)
AST: 12 U/L — ABNORMAL LOW (ref 15–41)
Albumin: 3.9 g/dL (ref 3.5–5.0)
Alkaline Phosphatase: 63 U/L (ref 38–126)
Anion gap: 5 (ref 5–15)
BUN: 15 mg/dL (ref 6–20)
CO2: 26 mmol/L (ref 22–32)
Calcium: 9 mg/dL (ref 8.9–10.3)
Chloride: 108 mmol/L (ref 98–111)
Creatinine: 0.6 mg/dL (ref 0.44–1.00)
GFR, Estimated: >60 mL/min (ref >60)
Glucose, Bld: 87 mg/dL (ref 70–99)
Potassium: 3.8 mmol/L (ref 3.5–5.1)
Sodium: 139 mmol/L (ref 135–145)
Total Bilirubin: 0.3 mg/dL (ref 0.0–1.2)
Total Protein: 7 g/dL (ref 6.5–8.1)

## 2024-05-09 LAB — CBC WITH DIFFERENTIAL (CANCER CENTER ONLY)
Abs Immature Granulocytes: 0.06 10*3/uL (ref 0.00–0.07)
Basophils Absolute: 0.1 10*3/uL (ref 0.0–0.1)
Basophils Relative: 1 %
Eosinophils Absolute: 0.3 10*3/uL (ref 0.0–0.5)
Eosinophils Relative: 4 %
HCT: 37 % (ref 36.0–46.0)
Hemoglobin: 12 g/dL (ref 12.0–15.0)
Immature Granulocytes: 1 %
Lymphocytes Relative: 33 %
Lymphs Abs: 2.9 10*3/uL (ref 0.7–4.0)
MCH: 28 pg (ref 26.0–34.0)
MCHC: 32.4 g/dL (ref 30.0–36.0)
MCV: 86.2 fL (ref 80.0–100.0)
Monocytes Absolute: 0.6 10*3/uL (ref 0.1–1.0)
Monocytes Relative: 7 %
Neutro Abs: 5 10*3/uL (ref 1.7–7.7)
Neutrophils Relative %: 54 %
Platelet Count: 395 10*3/uL (ref 150–400)
RBC: 4.29 MIL/uL (ref 3.87–5.11)
RDW: 18.5 % — ABNORMAL HIGH (ref 11.5–15.5)
WBC Count: 9 10*3/uL (ref 4.0–10.5)
nRBC: 0 % (ref 0.0–0.2)

## 2024-05-09 LAB — RETIC PANEL
Immature Retic Fract: 11.8 % (ref 2.3–15.9)
RBC.: 4.23 MIL/uL (ref 3.87–5.11)
Retic Count, Absolute: 95.6 10*3/uL (ref 19.0–186.0)
Retic Ct Pct: 2.3 % (ref 0.4–3.1)
Reticulocyte Hemoglobin: 36.2 pg (ref >27.9)

## 2024-05-09 LAB — IRON AND IRON BINDING CAPACITY (CC-WL,HP ONLY)
Iron: 121 ug/dL (ref 28–170)
Saturation Ratios: 35 % — ABNORMAL HIGH (ref 10.4–31.8)
TIBC: 347 ug/dL (ref 250–450)
UIBC: 226 ug/dL (ref 148–442)

## 2024-05-10 LAB — FERRITIN: Ferritin: 286 ng/mL (ref 11–307)

## 2024-05-16 ENCOUNTER — Inpatient Hospital Stay (HOSPITAL_BASED_OUTPATIENT_CLINIC_OR_DEPARTMENT_OTHER): Admitting: Hematology and Oncology

## 2024-05-16 VITALS — BP 120/76 | HR 74 | Temp 97.8°F | Resp 15 | Wt 207.6 lb

## 2024-05-16 DIAGNOSIS — D509 Iron deficiency anemia, unspecified: Secondary | ICD-10-CM | POA: Diagnosis not present

## 2024-05-16 DIAGNOSIS — D500 Iron deficiency anemia secondary to blood loss (chronic): Secondary | ICD-10-CM | POA: Diagnosis not present

## 2024-05-16 NOTE — Progress Notes (Signed)
 [facility] [phone]   Fax:([phone]  PROGRESS NOTE  Patient Care Team: Sherwood Donath as PCP - General (Physician Assistant) Laine Piggs, NP as Nurse Practitioner (Nurse Practitioner)  Hematological/Oncological History # Iron Deficiency Anemia 2/2 to Unclear Etiology (suspect GI loss)  01/28/2024: ferritin 8, iron sat 5%, white blood cell 10.9, hemoglobin 11.3, MCV 90, platelets 597  03/08/2024: establish care with Dr. Rosaline Coma  04/02/2024-04/12/2024: IV feraheme x 2 doses   Interval History:  Desiree Morse 53 y.o. female with medical history significant for IDA  presents for a follow up visit. The patient's last visit was on 03/08/2024. In the interim since the last visit she received 2 doses of IV iron.   On exam today Desiree Morse is accompanied by her daughter.  She reports the iron infusions went quite well and that she received the 2 doses of Feraheme with no side effects.  She had no itching, rash, headache.  She reports it gave her a boost of energy, however she is also having an increase in appetite.  She reports that she is eating quite a bit.  She notes that her energy is currently about a 6 out of 10.  She is having some occasional lightheadedness, dizziness, and shortness of breath.  She denies any bleeding such as bleeding of the gums, blood in the urine, or stool.  She reports no dark stools.  She notes that she does not eat red meat because it causes her nausea but she is eating some dark green vegetables but "not much".  She reports nothing else is out of the ordinary though she is having increasing anxiety.  Otherwise she denies any fevers, chills, sweats, nausea, vomiting or diarrhea.  A full 10 point ROS is otherwise negative.  MEDICAL HISTORY:  Past Medical History:  Diagnosis Date   Anemia    Diabetes mellitus without complication (HCC)    Hypercholesteremia    Hypertension     SURGICAL HISTORY: Past Surgical  History:  Procedure Laterality Date   DILATION AND CURETTAGE OF UTERUS      SOCIAL HISTORY: Social History   Socioeconomic History   Marital status: Married    Spouse name: Not on file   Number of children: Not on file   Years of education: Not on file   Highest education level: Not on file  Occupational History   Not on file  Tobacco Use   Smoking status: Never   Smokeless tobacco: Not on file  Vaping Use   Vaping status: Never Used  Substance and Sexual Activity   Alcohol use: No   Drug use: No   Sexual activity: Yes    Partners: Male    Birth control/protection: I.U.D.    Comment: paragard inserted 2010  Other Topics Concern   Not on file  Social History Narrative   Not on file   Social Drivers of Health   Financial Resource Strain: Not on file  Food Insecurity: Not on file  Transportation Needs: Not on file  Physical Activity: Not on file  Stress: Not on file  Social Connections: Unknown (04/30/2022)   Received from [facility]   Social Network    Social Network: Not on file  Intimate Partner Violence: Unknown (03/23/2022)   Received from [facility], [facility]   HITS    Physically Hurt: Not on file    Insult or Talk Down To: Not on file    Threaten Physical Harm: Not  on file    Scream or Curse: Not on file    FAMILY HISTORY: Family History  Problem Relation Age of Onset   Diabetes Mother    Thyroid disease Mother    Colon cancer Neg Hx     ALLERGIES:  is allergic to benzonatate and penicillins.  MEDICATIONS:  Current Outpatient Medications  Medication Sig Dispense Refill   atorvastatin (LIPITOR) 80 MG tablet Take 80 mg by mouth daily.     Blood Glucose Monitoring Suppl (GLUCOCOM BLOOD GLUCOSE MONITOR) DEVI Use meter to check blood sugar 2 times a day.  Please dispense based on availability, insurance coverage, and patient preference.     Dulaglutide (TRULICITY) 0.75 MG/0.5ML SOPN Patient takes on Tuesday weekly      ergocalciferol (VITAMIN D2) 1.25 MG (50000 UT) capsule ergocalciferol (vitamin D2) 1,250 mcg (50,000 unit) capsule  TAKE ONE CAPSULE BY MOUTH WEEKLY     glucose blood (PRECISION QID TEST) test strip      Lancet Devices (SIMPLE DIAGNOSTICS LANCING DEV) MISC      levonorgestrel  (MIRENA ) 20 MCG/DAY IUD 1 each by Intrauterine route once. Inserted 04/21/22     lisinopril (ZESTRIL) 5 MG tablet lisinopril 5 mg tablet  TAKE 1 TABLET BY MOUTH EVERY DAY     Magnesium 100 MG CAPS Take 100 mg by mouth daily.     No current facility-administered medications for this visit.    REVIEW OF SYSTEMS:   Constitutional: ( - ) fevers, ( - )  chills , ( - ) night sweats Eyes: ( - ) blurriness of vision, ( - ) double vision, ( - ) watery eyes Ears, nose, mouth, throat, and face: ( - ) mucositis, ( - ) sore throat Respiratory: ( - ) cough, ( - ) dyspnea, ( - ) wheezes Cardiovascular: ( - ) palpitation, ( - ) chest discomfort, ( - ) lower extremity swelling Gastrointestinal:  ( - ) nausea, ( - ) heartburn, ( - ) change in bowel habits Skin: ( - ) abnormal skin rashes Lymphatics: ( - ) new lymphadenopathy, ( - ) easy bruising Neurological: ( - ) numbness, ( - ) tingling, ( - ) new weaknesses Behavioral/Psych: ( - ) mood change, ( - ) new changes  All other systems were reviewed with the patient and are negative.  PHYSICAL EXAMINATION:  Vitals:   05/16/24 1524  BP: 120/76  Pulse: 74  Resp: 15  Temp: 97.8 F (36.6 C)  SpO2: 100%   Filed Weights   05/16/24 1524  Weight: 207 lb 9.6 oz (94.2 kg)    GENERAL: Well-appearing middle-aged Hispanic female, alert, no distress and comfortable SKIN: skin color, texture, turgor are normal, no rashes or significant lesions EYES: conjunctiva are pink and non-injected, sclera clear LUNGS: clear to auscultation and percussion with normal breathing effort HEART: regular rate & rhythm and no murmurs and no lower extremity edema Musculoskeletal: no cyanosis of digits  and no clubbing  PSYCH: alert & oriented x 3, fluent speech NEURO: no focal motor/sensory deficits  LABORATORY DATA:  I have reviewed the data as listed    Latest Ref Rng & Units 05/09/2024    2:49 PM 03/08/2024    2:32 PM 11/16/2011    8:56 AM  CBC  WBC 4.0 - 10.5 K/uL 9.0  10.2  10.7   Hemoglobin 12.0 - 15.0 g/dL 16.1  09.6  04.5   Hematocrit 36.0 - 46.0 % 37.0  36.6  39.7   Platelets 150 - 400 K/uL  395  491  310        Latest Ref Rng & Units 05/09/2024    2:49 PM 03/08/2024    2:32 PM 11/16/2011    8:56 AM  CMP  Glucose 70 - 99 mg/dL 87  846  93   BUN 6 - 20 mg/dL 15  12  9    Creatinine 0.44 - 1.00 mg/dL 9.62  9.52  8.41   Sodium 135 - 145 mmol/L 139  137  134   Potassium 3.5 - 5.1 mmol/L 3.8  4.0  4.9   Chloride 98 - 111 mmol/L 108  104  102   CO2 22 - 32 mmol/L 26  27  26    Calcium 8.9 - 10.3 mg/dL 9.0  9.9  9.2   Total Protein 6.5 - 8.1 g/dL 7.0  7.6  7.5   Total Bilirubin 0.0 - 1.2 mg/dL 0.3  0.5  0.2   Alkaline Phos 38 - 126 U/L 63  63  75   AST 15 - 41 U/L 12  14  23    ALT 0 - 44 U/L 12  11  15      RADIOGRAPHIC STUDIES: No results found.  ASSESSMENT & PLAN Desiree Morse 53 y.o. female with medical history significant for IDA  presents for a follow up visit.   After review of the labs, review of the records, and discussion with the patient the patients findings are most consistent with iron deficiency anemia of unclear etiology, suspect GI source.   # Iron Deficiency Anemia 2/2 to GI Bleeding (Suspected) -- Findings are consistent with iron deficiency anemia secondary to GI bleeding.  --Encouraged patient to follow-up with GI for continued monitoring/evaluation, patient has seen GI before.  --patient last had colonoscopy in 2023 but no EGD.  --labs show white blood cell 9.0, hemoglobin 12.0, MCV 86.2, platelets 395.  Ferritin 286 with a saturation rate of 35% --Patient is unable to tolerate p.o. iron therapy due to constipation/stomach upset.  --We will  plan to proceed with IV iron therapy in order to help bolster the patient's blood counts if she becomes iron deficient or anemic again.  --Plan for return to clinic in 3 months for labs and 6 months for clinic visit   No orders of the defined types were placed in this encounter.   All questions were answered. The patient knows to call the clinic with any problems, questions or concerns.  A total of more than 30 minutes were spent on this encounter with face-to-face time and non-face-to-face time, including preparing to see the patient, ordering tests and/or medications, counseling the patient and coordination of care as outlined above.   Rogerio Clay, MD Department of Hematology/Oncology [facility] at [facility] Phone: [phone] Pager: [phone] Email: [email or web address]  05/16/2024 7:19 PM

## 2024-05-22 ENCOUNTER — Ambulatory Visit: Admitting: Physician Assistant

## 2024-05-22 ENCOUNTER — Other Ambulatory Visit (HOSPITAL_COMMUNITY): Payer: Self-pay

## 2024-05-22 ENCOUNTER — Encounter: Payer: Self-pay | Admitting: Physician Assistant

## 2024-05-22 ENCOUNTER — Telehealth: Payer: Self-pay

## 2024-05-22 VITALS — BP 122/76 | HR 91 | Resp 96 | Ht 63.0 in | Wt 205.1 lb

## 2024-05-22 DIAGNOSIS — D509 Iron deficiency anemia, unspecified: Secondary | ICD-10-CM | POA: Diagnosis not present

## 2024-05-22 DIAGNOSIS — Z860101 Personal history of adenomatous and serrated colon polyps: Secondary | ICD-10-CM

## 2024-05-22 DIAGNOSIS — R1319 Other dysphagia: Secondary | ICD-10-CM

## 2024-05-22 DIAGNOSIS — R1310 Dysphagia, unspecified: Secondary | ICD-10-CM

## 2024-05-22 DIAGNOSIS — K219 Gastro-esophageal reflux disease without esophagitis: Secondary | ICD-10-CM | POA: Diagnosis not present

## 2024-05-22 DIAGNOSIS — Z7985 Long-term (current) use of injectable non-insulin antidiabetic drugs: Secondary | ICD-10-CM

## 2024-05-22 DIAGNOSIS — R112 Nausea with vomiting, unspecified: Secondary | ICD-10-CM | POA: Diagnosis not present

## 2024-05-22 DIAGNOSIS — E669 Obesity, unspecified: Secondary | ICD-10-CM

## 2024-05-22 DIAGNOSIS — E119 Type 2 diabetes mellitus without complications: Secondary | ICD-10-CM

## 2024-05-22 DIAGNOSIS — D500 Iron deficiency anemia secondary to blood loss (chronic): Secondary | ICD-10-CM

## 2024-05-22 DIAGNOSIS — Z6836 Body mass index (BMI) 36.0-36.9, adult: Secondary | ICD-10-CM

## 2024-05-22 MED ORDER — FAMOTIDINE 40 MG PO TABS: 40.0000 mg | ORAL_TABLET | Freq: Every day | ORAL | 0 refills | Status: AC

## 2024-05-22 NOTE — Patient Instructions (Addendum)
 Tome su inhibidor de la bomba de protones, pepcid 40 mg por la noche Suspenda la crcuma. Evite las comidas picantes y cidas. Evite las comidas grasas. Limite el consumo de caf, t, alcohol y bebidas carbonatadas. Esfurcese por mantener un peso saludable. Mantenga la cabecera de la cama elevada al menos 7,5 cm con bloques o una almohada de cua si presenta algn sntoma nocturno. Permanezca erguido durante 2 horas despus de comer. Evite las comidas y refrigerios de 3 a 4 horas antes de [place].  You have been scheduled for an endoscopy. Please follow written instructions given to you at your visit today.  If you use inhalers (even only as needed), please bring them with you on the day of your procedure.  If you take any of the following medications, they will need to be adjusted prior to your procedure:   DO NOT TAKE 7 DAYS PRIOR TO TEST- Trulicity (dulaglutide) Ozempic, Wegovy (semaglutide) Mounjaro (tirzepatide) Bydureon Bcise (exanatide extended release)  DO NOT TAKE 1 DAY PRIOR TO YOUR TEST Rybelsus (semaglutide) Adlyxin (lixisenatide) Victoza (liraglutide) Byetta (exanatide) ___________________________________________________________________________  Thank you for trusting me with your gastrointestinal care!   Santina Cull, PA-C  _______________________________________________________  If your blood pressure at your visit was 140/90 or greater, please contact your primary care physician to follow up on this.  _______________________________________________________  If you are age 61 or older, your body mass index should be between 23-30. Your Body mass index is 36.34 kg/m. If this is out of the aforementioned range listed, please consider follow up with your Primary Care Provider.  If you are age 1 or younger, your body mass index should be between 19-25. Your Body mass index is 36.34 kg/m. If this is out of the aformentioned range listed, please consider  follow up with your Primary Care Provider.   ________________________________________________________  The [place] [facility] providers would like to encourage you to use MYCHART to communicate with providers for non-urgent requests or questions.  Due to long hold times on the telephone, sending your provider a message by [facility] may be a faster and more efficient way to get a response.  Please allow 48 business hours for a response.  Please remember that this is for non-urgent requests.  _______________________________________________________

## 2024-05-22 NOTE — Progress Notes (Signed)
 05/22/2024 Danelle Dunning [id number] 03/01/1971  Referring provider: Gabino Joe, PA-C Primary GI doctor: Dr. Rosaline Coma  ASSESSMENT AND PLAN:  Iron deficiency anemia 01/28/2024: ferritin 8, iron sat 5%, white blood cell 10.9, hemoglobin 11.3, MCV 90, platelets 597  Status post IV Feraheme x 2 doses 4/14 and 4/24 05/09/2024  HGB 12.0 MCV 86.2 Platelets 395 05/09/2024 Iron 121 Ferritin 286 Recent Labs    03/08/24 1432 05/09/24 1449  HGB 11.7* 12.0  Following with Dr. Rosaline Coma in hematology last visit 05/16/2024 -Just had colonoscopy 2 years ago, has UGI symptoms, will plan on EGD to evaluate for H pylori, gastritis, PUD, esophagitis -If negative consider VCE - continue supportive care  GERD with dysphagia, nausea and vomiting X 3 months, stopped PPI 1.5 months ago Dysphagia with solids, no issues with liquids, has had regurgitation x 2 things, worse with acidic foods No ETOH, rare aleve once a week - start on pepcid 40 mg at night  -Lifestyle changes discussed, avoid NSAIDS, ETOH, hand out given to the patient -Weight loss discussed with the patient -Schedule EGD at [facility] to evaluate GERd, esophagitis, hiatal hernia,I discussed risks of EGD with patient today, including risk of sedation, bleeding or perforation. Patient provides understanding and gave verbal consent to proceed. - consider GES or stopping trulicity  Personal history of tubular adenomatous polyps 05/18/2022 colonoscopy Dr. Rosaline Coma for screening purposes normal TI, 3 mm polyp in ascending colon, diverticula nonbleeding internal hemorrhoids good bowel prep.  Showed TA polyps recall 5 years  Type 2 diabetes On Trulicity x 1 year  Morbid obesity  Body mass index is 36.34 kg/m.  -Patient has been advised to make an attempt to improve diet and exercise patterns to aid in weight loss. -Recommended diet heavy in fruits and veggies and low in animal meats, cheeses, and dairy products, appropriate calorie  intake   Patient Care Team: Lonergan, Malia A, PA-C as PCP - General (Physician Assistant) Chrzanowski, Jami B, NP as Nurse Practitioner (Nurse Practitioner)  HISTORY OF PRESENT ILLNESS: 53 y.o.spanish speaking  female with a past medical history listed below presents for evaluation of IDA.   Discussed the use of AI scribe software for clinical note transcription with the patient, who gave verbal consent to proceed.  History of Present Illness   Sharian Delia is a 53 year old female with a history of colon polyps who presents with iron deficiency anemia and gastrointestinal symptoms.  She has been experiencing iron deficiency anemia, which was not present in the past. No blood in stool or melena. No nausea or vomiting. She has been receiving IV iron and feels better since starting this treatment.  She has been experiencing heartburn and a sensation of food or pills moving slowly through her esophagus for the past three months. She stopped taking medication for heartburn as it interfered with iron absorption. No issues with liquids but reports regurgitation on two occasions, triggered by spicy or acidic foods.  She has been on Trulicity for diabetes for about a year. She occasionally takes Aleve, about once a week, for headaches which have improved with iron supplementation. She experiences bloating and early satiety but denies any pain.  She does not consume alcohol.      She  reports that she has never smoked. She does not have any smokeless tobacco history on file. She reports that she does not drink alcohol and does not use drugs.  RELEVANT GI HISTORY, IMAGING AND LABS: Results   DIAGNOSTIC Colonoscopy: Colon  polyps, one diminutive (05/22/2022)      CBC    Component Value Date/Time   WBC 9.0 05/09/2024 1449   WBC 10.7 (H) 11/16/2011 0856   RBC 4.29 05/09/2024 1449   RBC 4.23 05/09/2024 1448   HGB 12.0 05/09/2024 1449   HCT 37.0 05/09/2024 1449   PLT 395 05/09/2024  1449   MCV 86.2 05/09/2024 1449   MCH 28.0 05/09/2024 1449   MCHC 32.4 05/09/2024 1449   RDW 18.5 (H) 05/09/2024 1449   LYMPHSABS 2.9 05/09/2024 1449   MONOABS 0.6 05/09/2024 1449   EOSABS 0.3 05/09/2024 1449   BASOSABS 0.1 05/09/2024 1449   Recent Labs    03/08/24 1432 05/09/24 1449  HGB 11.7* 12.0    CMP     Component Value Date/Time   NA 139 05/09/2024 1449   K 3.8 05/09/2024 1449   CL 108 05/09/2024 1449   CO2 26 05/09/2024 1449   GLUCOSE 87 05/09/2024 1449   BUN 15 05/09/2024 1449   CREATININE 0.60 05/09/2024 1449   CALCIUM 9.0 05/09/2024 1449   PROT 7.0 05/09/2024 1449   ALBUMIN 3.9 05/09/2024 1449   AST 12 (L) 05/09/2024 1449   ALT 12 05/09/2024 1449   ALKPHOS 63 05/09/2024 1449   BILITOT 0.3 05/09/2024 1449   GFRNONAA >60 05/09/2024 1449   GFRAA >90 11/16/2011 0856      Latest Ref Rng & Units 05/09/2024    2:49 PM 03/08/2024    2:32 PM 11/16/2011    8:56 AM  Hepatic Function  Total Protein 6.5 - 8.1 g/dL 7.0  7.6  7.5   Albumin 3.5 - 5.0 g/dL 3.9  4.3  3.8   AST 15 - 41 U/L 12  14  23    ALT 0 - 44 U/L 12  11  15    Alk Phosphatase 38 - 126 U/L 63  63  75   Total Bilirubin 0.0 - 1.2 mg/dL 0.3  0.5  0.2       Current Medications:   Current Outpatient Medications (Endocrine & Metabolic):    Dulaglutide (TRULICITY) 0.75 MG/0.5ML SOPN, Patient takes on Tuesday weekly   levonorgestrel  (MIRENA ) 20 MCG/DAY IUD, 1 each by Intrauterine route once. Inserted 04/21/22  Current Outpatient Medications (Cardiovascular):    atorvastatin (LIPITOR) 80 MG tablet, Take 80 mg by mouth daily.   lisinopril (ZESTRIL) 5 MG tablet, lisinopril 5 mg tablet  TAKE 1 TABLET BY MOUTH EVERY DAY     Current Outpatient Medications (Other):    Blood Glucose Monitoring Suppl (GLUCOCOM BLOOD GLUCOSE MONITOR) DEVI, Use meter to check blood sugar 2 times a day.  Please dispense based on availability, insurance coverage, and patient preference.   ergocalciferol (VITAMIN D2) 1.25 MG  (50000 UT) capsule, ergocalciferol (vitamin D2) 1,250 mcg (50,000 unit) capsule  TAKE ONE CAPSULE BY MOUTH WEEKLY   famotidine (PEPCID) 40 MG tablet, Take 1 tablet (40 mg total) by mouth at bedtime.   glucose blood (PRECISION QID TEST) test strip,    Lancet Devices (SIMPLE DIAGNOSTICS LANCING DEV) MISC,    Magnesium 100 MG CAPS, Take 100 mg by mouth daily.  Medical History:  Past Medical History:  Diagnosis Date   Anemia    Diabetes mellitus without complication (HCC)    Hypercholesteremia    Hypertension    Allergies:  Allergies  Allergen Reactions   Benzonatate Other (See Comments)   Penicillins Hives     Surgical History:  She  has a past surgical history that includes Dilation  and curettage of uterus. Family History:  Her family history includes Diabetes in her mother; Thyroid disease in her mother.  REVIEW OF SYSTEMS  : All other systems reviewed and negative except where noted in the History of Present Illness.  PHYSICAL EXAM: BP 122/76   Pulse 91   Resp (!) 96   Ht 5\' 3"  (1.6 m)   Wt 205 lb 2 oz (93 kg)   BMI 36.34 kg/m  Physical Exam   GENERAL APPEARANCE: Well nourished, in no apparent distress. HEENT: No cervical lymphadenopathy, unremarkable thyroid, sclerae anicteric, conjunctiva pink. RESPIRATORY: Respiratory effort normal, breath sounds equal bilaterally without rales, rhonchi, or wheezing. Lungs clear to auscultation bilaterally. CARDIO: Regular rate and rhythm with no murmurs, rubs, or gallops, peripheral pulses intact. ABDOMEN: Soft, non-distended, active bowel sounds in all four quadrants, non-tender to palpation, no rebound, no mass appreciated. RECTAL: Declines. MUSCULOSKELETAL: Full range of motion, normal gait, without edema. SKIN: Dry, intact without rashes or lesions. No jaundice. NEURO: Alert, oriented, no focal deficits. PSYCH: Cooperative, normal mood and affect.      Edmonia Gottron, PA-C 2:53 PM

## 2024-05-22 NOTE — Progress Notes (Signed)
 I agree with the assessment and plan as outlined by Ms. Steffanie Dunn.

## 2024-05-22 NOTE — Telephone Encounter (Signed)
 Pharmacy Patient Advocate Encounter   Received notification from Patient Pharmacy that prior authorization for Famotidine 40mg  Tablets is required/requested.   Insurance verification completed.   The patient is insured through [facility] .    DENIED.  OTC medications and equivalents are excluded from pharmacy benefit plan  Pharmacy notified.

## 2024-06-27 ENCOUNTER — Encounter: Payer: Self-pay | Admitting: Internal Medicine

## 2024-06-28 ENCOUNTER — Telehealth: Payer: Self-pay | Admitting: Internal Medicine

## 2024-06-28 NOTE — Telephone Encounter (Signed)
 I called patient due her responding NO to the automated reminder calls on 06/27/2024 at 9:27 am for her coming up procedure on the 16th to double check if she would like to cancel or reschedule her appointment. No answer, unable to leave voice message.

## 2024-07-02 ENCOUNTER — Telehealth: Payer: Self-pay | Admitting: Internal Medicine

## 2024-07-02 NOTE — Telephone Encounter (Signed)
 Inbound call from patient stated she would like a call back in regards to how much her insurance would cover for her procedure that is coming up 07/04/2024 with Dr.Dorsey .  Please advise Thank you

## 2024-07-04 ENCOUNTER — Ambulatory Visit: Admitting: Internal Medicine

## 2024-07-04 ENCOUNTER — Encounter: Payer: Self-pay | Admitting: Internal Medicine

## 2024-07-04 VITALS — BP 106/72 | HR 72 | Temp 97.7°F | Resp 24 | Ht 63.0 in | Wt 205.0 lb

## 2024-07-04 DIAGNOSIS — K319 Disease of stomach and duodenum, unspecified: Secondary | ICD-10-CM | POA: Diagnosis not present

## 2024-07-04 DIAGNOSIS — D500 Iron deficiency anemia secondary to blood loss (chronic): Secondary | ICD-10-CM

## 2024-07-04 DIAGNOSIS — K209 Esophagitis, unspecified without bleeding: Secondary | ICD-10-CM | POA: Diagnosis present

## 2024-07-04 DIAGNOSIS — K2970 Gastritis, unspecified, without bleeding: Secondary | ICD-10-CM

## 2024-07-04 DIAGNOSIS — R1319 Other dysphagia: Secondary | ICD-10-CM

## 2024-07-04 DIAGNOSIS — K219 Gastro-esophageal reflux disease without esophagitis: Secondary | ICD-10-CM

## 2024-07-04 DIAGNOSIS — K208 Other esophagitis without bleeding: Secondary | ICD-10-CM | POA: Diagnosis not present

## 2024-07-04 MED ORDER — SODIUM CHLORIDE 0.9 % IV SOLN: 500.0000 mL | Freq: Once | INTRAVENOUS | Status: DC

## 2024-07-04 MED ORDER — PANTOPRAZOLE SODIUM 40 MG PO TBEC: 40.0000 mg | DELAYED_RELEASE_TABLET | Freq: Every day | ORAL | 3 refills | Status: AC

## 2024-07-04 NOTE — Progress Notes (Signed)
 Report given to PACU, vss

## 2024-07-04 NOTE — Progress Notes (Signed)
 Pt's states no medical or surgical changes since previsit or office visit.

## 2024-07-04 NOTE — Progress Notes (Signed)
 GASTROENTEROLOGY PROCEDURE H&P NOTE   Primary Care Physician: Mason Haywood LABOR, PA-C    Reason for Procedure:   IDA, GERD, dysphagia  Plan:    EGD  Patient is appropriate for endoscopic procedure(s) in the ambulatory ([facility]) setting.  The nature of the procedure, as well as the risks, benefits, and alternatives were carefully and thoroughly reviewed with the patient. Ample time for discussion and questions allowed. The patient understood, was satisfied, and agreed to proceed.     HPI: Desiree Morse is a 53 y.o. female who presents for EGD for evaluation of IDA, GERD, and dysphagia.  Patient was most recently seen in the Gastroenterology Clinic on 05/22/24.  No interval change in medical history since that appointment. Please refer to that note for full details regarding GI history and clinical presentation.   Past Medical History:  Diagnosis Date   Anemia    Diabetes mellitus without complication (HCC)    Hypercholesteremia    Hypertension     Past Surgical History:  Procedure Laterality Date   DILATION AND CURETTAGE OF UTERUS      Prior to Admission medications   Medication Sig Start Date End Date Taking? Authorizing Provider  atorvastatin (LIPITOR) 80 MG tablet Take 80 mg by mouth daily.    [provider]  Blood Glucose Monitoring Suppl (GLUCOCOM BLOOD GLUCOSE MONITOR) DEVI Use meter to check blood sugar 2 times a day.  Please dispense based on availability, insurance coverage, and patient preference. 01/22/21   [provider]  Dulaglutide (TRULICITY) 0.75 MG/0.5ML SOPN Patient takes on Tuesday weekly    [provider]  ergocalciferol (VITAMIN D2) 1.25 MG (50000 UT) capsule ergocalciferol (vitamin D2) 1,250 mcg (50,000 unit) capsule  TAKE ONE CAPSULE BY MOUTH WEEKLY    [provider]  famotidine  (PEPCID ) 40 MG tablet Take 1 tablet (40 mg total) by mouth at bedtime. 05/22/24   Craig Alan SAUNDERS, PA-C  glucose blood (PRECISION QID  TEST) test strip  01/22/21   [provider]  Lancet Devices (SIMPLE DIAGNOSTICS LANCING DEV) MISC  01/22/21   [provider]  levonorgestrel  (MIRENA ) 20 MCG/DAY IUD 1 each by Intrauterine route once. Inserted 04/21/22    [provider]  lisinopril (ZESTRIL) 5 MG tablet lisinopril 5 mg tablet  TAKE 1 TABLET BY MOUTH EVERY DAY    [provider]  Magnesium 100 MG CAPS Take 100 mg by mouth daily. 02/13/24   [provider]    Current Outpatient Medications  Medication Sig Dispense Refill   atorvastatin (LIPITOR) 80 MG tablet Take 80 mg by mouth daily.     Blood Glucose Monitoring Suppl (GLUCOCOM BLOOD GLUCOSE MONITOR) DEVI Use meter to check blood sugar 2 times a day.  Please dispense based on availability, insurance coverage, and patient preference.     Dulaglutide (TRULICITY) 0.75 MG/0.5ML SOPN Patient takes on Tuesday weekly     ergocalciferol (VITAMIN D2) 1.25 MG (50000 UT) capsule ergocalciferol (vitamin D2) 1,250 mcg (50,000 unit) capsule  TAKE ONE CAPSULE BY MOUTH WEEKLY     famotidine  (PEPCID ) 40 MG tablet Take 1 tablet (40 mg total) by mouth at bedtime. 30 tablet 0   glucose blood (PRECISION QID TEST) test strip      Lancet Devices (SIMPLE DIAGNOSTICS LANCING DEV) MISC      levonorgestrel  (MIRENA ) 20 MCG/DAY IUD 1 each by Intrauterine route once. Inserted 04/21/22     lisinopril (ZESTRIL) 5 MG tablet lisinopril 5 mg tablet  TAKE 1 TABLET BY  MOUTH EVERY DAY     Magnesium 100 MG CAPS Take 100 mg by mouth daily.     Current Facility-Administered Medications  Medication Dose Route Frequency Provider Last Rate Last Admin   0.9 %  sodium chloride  infusion  500 mL Intravenous Once Federico Rosario BROCKS, MD        Allergies as of 07/04/2024 - Review Complete 05/22/2024  Allergen Reaction Noted   Benzonatate Other (See Comments) 02/17/2021   Penicillins Hives 11/16/2011    Family History  Problem Relation Age of Onset   Diabetes Mother    Thyroid  disease Mother    Colon cancer Neg Hx     Social History   Socioeconomic History   Marital status: Married    Spouse name: Not on file   Number of children: Not on file   Years of education: Not on file   Highest education level: Not on file  Occupational History   Not on file  Tobacco Use   Smoking status: Never   Smokeless tobacco: Not on file  Vaping Use   Vaping status: Never Used  Substance and Sexual Activity   Alcohol use: No   Drug use: No   Sexual activity: Yes    Partners: Male    Birth control/protection: I.U.D.    Comment: paragard inserted 2010  Other Topics Concern   Not on file  Social History Narrative   Not on file   Social Drivers of Health   Financial Resource Strain: Not on file  Food Insecurity: Not on file  Transportation Needs: Not on file  Physical Activity: Not on file  Stress: Not on file  Social Connections: Unknown (04/30/2022)   [facility]   Social Network    Social Network: Not on file  Intimate Partner Violence: Unknown (03/23/2022)   Received from [facility]   HITS    Physically Hurt: Not on file    Insult or Talk Down To: Not on file    Threaten Physical Harm: Not on file    Scream or Curse: Not on file    Physical Exam: Vital signs in last 24 hours: There were no vitals taken for this visit. GEN: NAD EYE: Sclerae anicteric ENT: MMM CV: Non-tachycardic Pulm: No increased WOB GI: Soft NEURO:  Alert & Oriented   Estefana Federico, MD [facility]   07/04/2024 2:41 PM

## 2024-07-04 NOTE — Op Note (Addendum)
 [facility] Patient Name: Desiree Morse Procedure Date: 07/04/2024 3:22 PM MRN: [id number] Endoscopist: Rosario Estefana Kidney , , [id number] Age: 53 Referring MD:  Date of Birth: April 01, 1971 Gender: Female Account #: [id number] Procedure:                Upper GI endoscopy Indications:              Iron deficiency anemia, Dysphagia, Heartburn Medicines:                Monitored Anesthesia Care Procedure:                Pre-Anesthesia Assessment:                           - Prior to the procedure, a History and Physical                            was performed, and patient medications and                            allergies were reviewed. The patient's tolerance of                            previous anesthesia was also reviewed. The risks                            and benefits of the procedure and the sedation                            options and risks were discussed with the patient.                            All questions were answered, and informed consent                            was obtained. Prior Anticoagulants: The patient has                            taken no anticoagulant or antiplatelet agents. ASA                            Grade Assessment: II - A patient with mild systemic                            disease. After reviewing the risks and benefits,                            the patient was deemed in satisfactory condition to                            undergo the procedure.                           After obtaining informed consent, the endoscope was  passed under direct vision. Throughout the                            procedure, the patient's blood pressure, pulse, and                            oxygen saturations were monitored continuously. The                            GIF [id number] #[id number] was introduced through the                            mouth, and advanced to the second part of duodenum.                             The upper GI endoscopy was accomplished without                            difficulty. The patient tolerated the procedure                            well. Scope In: Scope Out: Findings:                 Salmon-colored mucosa was present. The maximum                            longitudinal extent of these esophageal mucosal                            changes was 1 cm in length. Mucosa was biopsied                            with a cold forceps for histology. One specimen                            bottle was sent to pathology.                           Biopsies were taken with a cold forceps in the                            proximal esophagus and in the distal esophagus for                            histology.                           A guidewire was placed and the scope was withdrawn.                            Dilation was performed in the entire esophagus with                            a Savary dilator  with mild resistance at 19 mm.                           Localized moderate inflammation characterized by                            congestion (edema), erythema and linear erosions                            was found in the gastric antrum. Biopsies were                            taken with a cold forceps for histology.                           The examined duodenum was normal. Complications:            No immediate complications. Estimated Blood Loss:     Estimated blood loss was minimal. Impression:               - Salmon-colored mucosa. Biopsied.                           - Gastritis. Biopsied.                           - Normal examined duodenum.                           - Biopsies were taken with a cold forceps for                            histology in the proximal esophagus and in the                            distal esophagus.                           - Dilation performed in the entire esophagus. Recommendation:           - Discharge patient to home (with escort).                            - Await pathology results.                           - Start pantoprazole  40 mg daily.                           - Return to GI clinic in 2-3 months.                           - The findings and recommendations were discussed                            with the patient. Dr Estefana Federico Rosario Estefana Federico,  07/04/2024 3:55:47 PM

## 2024-07-04 NOTE — Progress Notes (Signed)
1534 Robinul 0.1 mg IV given due large amount of secretions upon assessment.  MD made aware, vss

## 2024-07-04 NOTE — Patient Instructions (Signed)
USTED TUVO UN PROCEDIMIENTO ENDOSCPICO HOY EN EL [facility]:   Lea el informe del procedimiento que se le entreg para cualquier pregunta especfica sobre lo que se Primary school teacher.  Si el informe del examen no responde a sus preguntas, por favor llame a su gastroenterlogo para aclararlo.  Si usted solicit que no se le den [employer] de lo que se Estate manager/land agent en su procedimiento al [employer] va a cuidar, entonces el informe del procedimiento se ha incluido en un sobre sellado para que usted lo revise despus cuando le sea ms conveniente.   LO QUE PUEDE ESPERAR: Algunas sensaciones de hinchazn en el abdomen.  Puede tener ms gases de lo normal.  El caminar puede ayudarle a eliminar el aire que se le puso en el tracto gastrointestinal durante el procedimiento y reducir la hinchazn.  Si le hicieron una endoscopia inferior (como una colonoscopia o una sigmoidoscopia flexible), podra notar manchas de sangre en las heces fecales o en el papel higinico.  Si se someti a una preparacin intestinal para su procedimiento, es posible que no tenga una evacuacin intestinal normal durante [employer].   Tenga en cuenta:  Es posible que note un poco de irritacin y congestin en la nariz o algn drenaje.  Esto es debido al oxgeno [employer] durante su procedimiento.  No hay que preocuparse y esto debe desaparecer ms o Scientist, research (medical).   SNTOMAS PARA REPORTAR INMEDIATAMENTE:   Despus de la endoscopia superior (EGD)  Vmitos de Biochemist, clinical o material como caf molido   Dolor en el pecho o dolor debajo de los omplatos que antes no tena   Dolor o dificultad persistente para tragar  Falta de aire que antes no tena   Fiebre de 100F o ms  Heces fecales negras y pegajosas   Para asuntos urgentes o de Freight forwarder, puede comunicarse con un gastroenterlogo a cualquier hora llamando al [phone].  DIETA:  Recomendamos una comida pequea al principio, pero luego puede continuar  con su dieta normal.  Tome muchos lquidos, Teacher, adult education las bebidas alcohlicas durante 24 horas.    ACTIVIDAD:  Debe planear tomarse las cosas con calma por el resto del da y no debe CONDUCIR ni usar maquinaria pesada Programmer, applications (debido a los medicamentos de sedacin utilizados durante el examen).     SEGUIMIENTO: Nuestro personal llamar al nmero que aparece en su historial al siguiente da hbil de su procedimiento para ver cmo se siente y para responder cualquier pregunta o inquietud que pueda tener con respecto a la informacin que se le dio despus del procedimiento. Si no podemos contactarle, le dejaremos un mensaje.  Sin embargo, si se siente bien y no tiene Paediatric nurse, no es necesario que nos devuelva la llamada.  Asumiremos que ha regresado a sus actividades diarias normales sin incidentes. Si se le tomaron algunas biopsias, le contactaremos por telfono o por carta en las prximas 3 semanas.  Si no ha sabido [employer] biopsias en el transcurso de 3 semanas, por favor llmenos al [phone].   FIRMAS/CONFIDENCIALIDAD: Usted y/o el acompaante que le cuide han firmado documentos que se ingresarn en su historial mdico electrnico.  Estas firmas atestiguan el hecho de que la informacin anterior

## 2024-07-05 ENCOUNTER — Telehealth: Payer: Self-pay

## 2024-07-05 NOTE — Telephone Encounter (Signed)
  Follow up Call-     07/04/2024    2:47 PM 05/18/2022    7:59 AM  Call back number  Post procedure Call Back phone  # [phone]  Permission to leave phone message Yes Yes     Patient questions:  Do you have a fever, pain , or abdominal swelling? No. Pain Score  0 *  Have you tolerated food without any problems? Yes.    Have you been able to return to your normal activities? Yes.    Do you have any questions about your discharge instructions: Diet   No. Medications  No. Follow up visit  No.  Do you have questions or concerns about your Care? No.  Actions: * If pain score is 4 or above: No action needed, pain <4.

## 2024-07-09 ENCOUNTER — Ambulatory Visit: Payer: Self-pay | Admitting: Internal Medicine

## 2024-07-09 LAB — SURGICAL PATHOLOGY

## 2024-08-15 ENCOUNTER — Inpatient Hospital Stay: Attending: Hematology and Oncology

## 2024-08-15 ENCOUNTER — Other Ambulatory Visit: Payer: Self-pay | Admitting: Hematology and Oncology

## 2024-08-15 DIAGNOSIS — D500 Iron deficiency anemia secondary to blood loss (chronic): Secondary | ICD-10-CM

## 2024-09-04 ENCOUNTER — Ambulatory Visit: Admitting: Physician Assistant

## 2024-09-04 NOTE — Progress Notes (Deleted)
 09/04/2024 Desiree Morse [id number] 10-18-1971  Referring provider: Mason Haywood LABOR, PA-C Primary GI doctor: Dr. Federico  ASSESSMENT AND PLAN:  Iron deficiency anemia 01/28/2024: ferritin 8, iron sat 5%, white blood cell 10.9, hemoglobin 11.3, MCV 90, platelets 597  Status post IV Feraheme x 2 doses 4/14 and 4/24 05/09/2024  HGB 12.0 MCV 86.2 Platelets 395 05/09/2024 Iron 121 Ferritin 286 Recent Labs    03/08/24 1432 05/09/24 1449  HGB 11.7* 12.0  Following with Dr. Federico in hematology last visit 05/16/2024, has upcoming appointment 11/28/2024 2023 colonoscopy diverticula hemorrhoids 3 mm TA polyp recall 5 years 07/04/2024 EGD  showed salmon-colored mucosa, gastritis, normal examined duodenum dilation of the entire esophagus.  Path mild reactive changes negative H. pylori negative EOE no evidence of Barrett's. - consider VCE - continue supportive care  GERD with dysphagia, nausea and vomiting status post dilation 06/2024 on GLP-1 for diabetes 07/04/2024 EGD  showed salmon-colored mucosa, gastritis, normal examined duodenum dilation of the entire esophagus.  Path mild reactive changes negative H. pylori negative EOE no evidence of Barrett's. No ETOH, rare aleve once a week - start on pepcid  40 mg at night  -Lifestyle changes discussed, avoid NSAIDS, ETOH, hand out given to the patient -Weight loss discussed with the patient - consider GES or stopping trulicity, gastroparesis diet given  Personal history of tubular adenomatous polyps 05/18/2022 colonoscopy Dr. Federico for screening purposes normal TI, 3 mm polyp in ascending colon, diverticula nonbleeding internal hemorrhoids good bowel prep.  Showed TA polyps recall 5 years  Type 2 diabetes On Trulicity x 1 year  Morbid obesity  There is no height or weight on file to calculate BMI.  -Patient has been advised to make an attempt to improve diet and exercise patterns to aid in weight loss. -Recommended diet heavy in fruits  and veggies and low in animal meats, cheeses, and dairy products, appropriate calorie intake   Patient Care Team: Lonergan, Malia A, PA-C as PCP - General (Physician Assistant) Chrzanowski, Jami B, NP as Nurse Practitioner (Nurse Practitioner)  HISTORY OF PRESENT ILLNESS: 53 y.o.spanish speaking  female with a past medical history listed below presents for evaluation of IDA.   Patient last seen in the office 05/22/2024, had EGD with Dr. Federico 07/04/2024 showed salmon-colored mucosa, gastritis, normal examined duodenum dilation of the entire esophagus.  Path mild reactive changes negative H. pylori negative EOE no evidence of Barrett's.  Discussed the use of AI scribe software for clinical note transcription with the patient, who gave verbal consent to proceed.  History of Present Illness          She  reports that she has never smoked. She does not have any smokeless tobacco history on file. She reports that she does not drink alcohol and does not use drugs.  RELEVANT GI HISTORY, IMAGING AND LABS: Results          CBC    Component Value Date/Time   WBC 9.0 05/09/2024 1449   WBC 10.7 (H) 11/16/2011 0856   RBC 4.29 05/09/2024 1449   RBC 4.23 05/09/2024 1448   HGB 12.0 05/09/2024 1449   HCT 37.0 05/09/2024 1449   PLT 395 05/09/2024 1449   MCV 86.2 05/09/2024 1449   MCH 28.0 05/09/2024 1449   MCHC 32.4 05/09/2024 1449   RDW 18.5 (H) 05/09/2024 1449   LYMPHSABS 2.9 05/09/2024 1449   MONOABS 0.6 05/09/2024 1449   EOSABS 0.3 05/09/2024 1449   BASOSABS 0.1 05/09/2024 1449  Recent Labs    03/08/24 1432 05/09/24 1449  HGB 11.7* 12.0    CMP     Component Value Date/Time   NA 139 05/09/2024 1449   K 3.8 05/09/2024 1449   CL 108 05/09/2024 1449   CO2 26 05/09/2024 1449   GLUCOSE 87 05/09/2024 1449   BUN 15 05/09/2024 1449   CREATININE 0.60 05/09/2024 1449   CALCIUM 9.0 05/09/2024 1449   PROT 7.0 05/09/2024 1449   ALBUMIN 3.9 05/09/2024 1449   AST 12 (L) 05/09/2024  1449   ALT 12 05/09/2024 1449   ALKPHOS 63 05/09/2024 1449   BILITOT 0.3 05/09/2024 1449   GFRNONAA >60 05/09/2024 1449   GFRAA >90 11/16/2011 0856      Latest Ref Rng & Units 05/09/2024    2:49 PM 03/08/2024    2:32 PM 11/16/2011    8:56 AM  Hepatic Function  Total Protein 6.5 - 8.1 g/dL 7.0  7.6  7.5   Albumin 3.5 - 5.0 g/dL 3.9  4.3  3.8   AST 15 - 41 U/L 12  14  23    ALT 0 - 44 U/L 12  11  15    Alk Phosphatase 38 - 126 U/L 63  63  75   Total Bilirubin 0.0 - 1.2 mg/dL 0.3  0.5  0.2       Current Medications:   Current Outpatient Medications (Endocrine & Metabolic):    Dulaglutide (TRULICITY) 0.75 MG/0.5ML SOPN, Patient takes on Tuesday weekly   levonorgestrel  (MIRENA ) 20 MCG/DAY IUD, 1 each by Intrauterine route once. Inserted 04/21/22   metFORMIN (GLUCOPHAGE) 1000 MG tablet, Take 1,000 mg by mouth 2 (two) times daily.  Current Outpatient Medications (Cardiovascular):    atorvastatin (LIPITOR) 80 MG tablet, Take 80 mg by mouth daily.   lisinopril (ZESTRIL) 5 MG tablet, lisinopril 5 mg tablet  TAKE 1 TABLET BY MOUTH EVERY DAY     Current Outpatient Medications (Other):    Blood Glucose Monitoring Suppl (GLUCOCOM BLOOD GLUCOSE MONITOR) DEVI, Use meter to check blood sugar 2 times a day.  Please dispense based on availability, insurance coverage, and patient preference.   ergocalciferol (VITAMIN D2) 1.25 MG (50000 UT) capsule, ergocalciferol (vitamin D2) 1,250 mcg (50,000 unit) capsule  TAKE ONE CAPSULE BY MOUTH WEEKLY   famotidine  (PEPCID ) 40 MG tablet, Take 1 tablet (40 mg total) by mouth at bedtime.   glucose blood (PRECISION QID TEST) test strip,    Lancet Devices (SIMPLE DIAGNOSTICS LANCING DEV) MISC,    Magnesium 100 MG CAPS, Take 100 mg by mouth daily. (Patient not taking: Reported on 07/04/2024)   pantoprazole  (PROTONIX ) 40 MG tablet, Take 1 tablet (40 mg total) by mouth daily.  Medical History:  Past Medical History:  Diagnosis Date   Anemia    Diabetes mellitus  without complication (HCC)    Hypercholesteremia    Hypertension    Allergies:  Allergies  Allergen Reactions   Benzonatate Other (See Comments)   Penicillins Hives     Surgical History:  She  has a past surgical history that includes Dilation and curettage of uterus. Family History:  Her family history includes Diabetes in her mother; Thyroid disease in her mother.  REVIEW OF SYSTEMS  : All other systems reviewed and negative except where noted in the History of Present Illness.  PHYSICAL EXAM: There were no vitals taken for this visit. Physical Exam          Alan JONELLE Coombs, PA-C 7:46 AM

## 2024-11-21 ENCOUNTER — Inpatient Hospital Stay: Attending: Hematology and Oncology

## 2024-11-27 ENCOUNTER — Telehealth: Payer: Self-pay | Admitting: Physician Assistant

## 2024-11-28 ENCOUNTER — Inpatient Hospital Stay: Admitting: Physician Assistant

## 2024-11-28 ENCOUNTER — Inpatient Hospital Stay

## 2024-12-12 ENCOUNTER — Inpatient Hospital Stay: Admitting: Physician Assistant
